# Patient Record
Sex: Female | Born: 1965 | Race: White | Hispanic: No | Smoking: Never smoker
Health system: Southern US, Community
[De-identification: ages and names within clinical notes are randomized; demographics above are authoritative.]

## PROBLEM LIST (undated history)

## (undated) DIAGNOSIS — M1990 Unspecified osteoarthritis, unspecified site: Secondary | ICD-10-CM

## (undated) DIAGNOSIS — R519 Headache, unspecified: Secondary | ICD-10-CM

## (undated) DIAGNOSIS — R32 Unspecified urinary incontinence: Secondary | ICD-10-CM

## (undated) DIAGNOSIS — B977 Papillomavirus as the cause of diseases classified elsewhere: Secondary | ICD-10-CM

## (undated) DIAGNOSIS — G4730 Sleep apnea, unspecified: Secondary | ICD-10-CM

## (undated) DIAGNOSIS — R51 Headache: Secondary | ICD-10-CM

## (undated) DIAGNOSIS — E7800 Pure hypercholesterolemia, unspecified: Secondary | ICD-10-CM

## (undated) DIAGNOSIS — K219 Gastro-esophageal reflux disease without esophagitis: Secondary | ICD-10-CM

## (undated) HISTORY — DX: Papillomavirus as the cause of diseases classified elsewhere: B97.7

## (undated) HISTORY — PX: VAGINA SURGERY: SHX829

## (undated) HISTORY — DX: Unspecified urinary incontinence: R32

## (undated) HISTORY — DX: Sleep apnea, unspecified: G47.30

## (undated) HISTORY — DX: Unspecified osteoarthritis, unspecified site: M19.90

## (undated) HISTORY — DX: Headache: R51

## (undated) HISTORY — DX: Pure hypercholesterolemia, unspecified: E78.00

## (undated) HISTORY — DX: Headache, unspecified: R51.9

## (undated) HISTORY — DX: Gastro-esophageal reflux disease without esophagitis: K21.9

---

## 2008-06-29 DIAGNOSIS — M6580 Other synovitis and tenosynovitis, unspecified site: Secondary | ICD-10-CM | POA: Insufficient documentation

## 2009-04-18 DIAGNOSIS — M25569 Pain in unspecified knee: Secondary | ICD-10-CM | POA: Insufficient documentation

## 2012-01-20 HISTORY — PX: SHOULDER SURGERY: SHX246

## 2012-02-23 DIAGNOSIS — G4733 Obstructive sleep apnea (adult) (pediatric): Secondary | ICD-10-CM | POA: Insufficient documentation

## 2012-02-23 DIAGNOSIS — F32A Depression, unspecified: Secondary | ICD-10-CM | POA: Insufficient documentation

## 2012-02-23 DIAGNOSIS — F329 Major depressive disorder, single episode, unspecified: Secondary | ICD-10-CM | POA: Insufficient documentation

## 2012-02-23 DIAGNOSIS — E039 Hypothyroidism, unspecified: Secondary | ICD-10-CM | POA: Insufficient documentation

## 2012-02-23 DIAGNOSIS — J309 Allergic rhinitis, unspecified: Secondary | ICD-10-CM | POA: Insufficient documentation

## 2012-04-19 DIAGNOSIS — E8881 Metabolic syndrome: Secondary | ICD-10-CM | POA: Insufficient documentation

## 2013-01-19 HISTORY — PX: PLANTAR FASCIA SURGERY: SHX746

## 2013-02-22 DIAGNOSIS — K219 Gastro-esophageal reflux disease without esophagitis: Secondary | ICD-10-CM | POA: Insufficient documentation

## 2014-10-08 HISTORY — PX: GALLBLADDER SURGERY: SHX652

## 2014-10-08 HISTORY — PX: GASTRIC BYPASS: SHX52

## 2015-02-25 LAB — HEPATIC FUNCTION PANEL
ALT: 18 U/L (ref 7–35)
AST: 26 U/L (ref 13–35)
Alkaline Phosphatase: 76 U/L (ref 25–125)
Bilirubin, Total: 0.4 mg/dL

## 2015-02-25 LAB — LIPID PANEL
Cholesterol: 126 mg/dL (ref 0–200)
HDL: 29 mg/dL — AB (ref 35–70)
LDL Cholesterol: 75 mg/dL
Triglycerides: 150 mg/dL (ref 40–160)

## 2015-02-25 LAB — BASIC METABOLIC PANEL
BUN: 13 mg/dL (ref 4–21)
Glucose: 83 mg/dL
POTASSIUM: 4.8 mmol/L (ref 3.4–5.3)
SODIUM: 143 mmol/L (ref 137–147)

## 2015-02-25 LAB — HEMOGLOBIN A1C: HEMOGLOBIN A1C: 5.1

## 2015-02-25 LAB — TSH: TSH: 2.26 u[IU]/mL (ref 0.41–5.90)

## 2015-04-24 DIAGNOSIS — G5702 Lesion of sciatic nerve, left lower limb: Secondary | ICD-10-CM | POA: Insufficient documentation

## 2015-12-04 DIAGNOSIS — J069 Acute upper respiratory infection, unspecified: Secondary | ICD-10-CM | POA: Diagnosis not present

## 2015-12-26 DIAGNOSIS — M722 Plantar fascial fibromatosis: Secondary | ICD-10-CM | POA: Diagnosis not present

## 2015-12-26 DIAGNOSIS — J0101 Acute recurrent maxillary sinusitis: Secondary | ICD-10-CM | POA: Diagnosis not present

## 2016-01-27 DIAGNOSIS — G4733 Obstructive sleep apnea (adult) (pediatric): Secondary | ICD-10-CM | POA: Diagnosis not present

## 2016-02-10 DIAGNOSIS — Z01419 Encounter for gynecological examination (general) (routine) without abnormal findings: Secondary | ICD-10-CM | POA: Diagnosis not present

## 2016-02-10 DIAGNOSIS — Z124 Encounter for screening for malignant neoplasm of cervix: Secondary | ICD-10-CM | POA: Diagnosis not present

## 2016-02-12 ENCOUNTER — Telehealth: Payer: Self-pay | Admitting: Family Medicine

## 2016-02-12 NOTE — Telephone Encounter (Signed)
Received this message via website:  I have had several symptoms that point towards my thyroid, but my dr did one blood test, and has said it is normal and has been that way for many years. I have always been very tired, dry skin and now over last 6-7 months or so, am having memory loss. I have read this could be sign of hypothyroidism, underactive which is what I am being treated for supposedly. I just feel further testing needs to be done and cant seem to get my pcp, who was at [facility] till I recently changed to a pcp with [place], whom I am awaiting appointment with in March. I don't know if I need to wait till then to see Dr Dallas Schimkeopeland or if I can be seen sooner with specialist. I am tired of being tired all the time.

## 2016-02-12 NOTE — Telephone Encounter (Signed)
Lindsay Neal- why don't we get her in sooner.  I can see her in a non new pt appt to discuss the thyroid.  Please give her a call.  Thank you

## 2016-02-12 NOTE — Telephone Encounter (Signed)
Appointment moved up for patient. Patient agreed.

## 2016-02-19 ENCOUNTER — Ambulatory Visit: Payer: Self-pay | Admitting: Family Medicine

## 2016-02-19 DIAGNOSIS — Z9884 Bariatric surgery status: Secondary | ICD-10-CM | POA: Diagnosis not present

## 2016-02-19 DIAGNOSIS — R5383 Other fatigue: Secondary | ICD-10-CM | POA: Diagnosis not present

## 2016-02-20 ENCOUNTER — Encounter: Payer: Self-pay | Admitting: Family Medicine

## 2016-02-20 ENCOUNTER — Ambulatory Visit (INDEPENDENT_AMBULATORY_CARE_PROVIDER_SITE_OTHER): Payer: BLUE CROSS/BLUE SHIELD | Admitting: Family Medicine

## 2016-02-20 VITALS — BP 110/70 | HR 71 | Ht 63.5 in | Wt 141.2 lb

## 2016-02-20 DIAGNOSIS — R5383 Other fatigue: Secondary | ICD-10-CM

## 2016-02-20 DIAGNOSIS — E034 Atrophy of thyroid (acquired): Secondary | ICD-10-CM

## 2016-02-20 DIAGNOSIS — Z9884 Bariatric surgery status: Secondary | ICD-10-CM | POA: Insufficient documentation

## 2016-02-20 DIAGNOSIS — Z9889 Other specified postprocedural states: Secondary | ICD-10-CM

## 2016-02-20 DIAGNOSIS — M7061 Trochanteric bursitis, right hip: Secondary | ICD-10-CM

## 2016-02-20 LAB — TSH: TSH: 1.07 u[IU]/mL (ref 0.35–4.50)

## 2016-02-20 MED ORDER — METHYLPREDNISOLONE ACETATE 40 MG/ML IJ SUSP
40.0000 mg | Freq: Once | INTRAMUSCULAR | Status: AC
  Administered 2016-02-20: 40 mg via INTRA_ARTICULAR

## 2016-02-20 NOTE — Patient Instructions (Addendum)
It was very nice to see you today!  I hope that the shot will help with your hip pain- please keep me posted about this We will check your thyroid today to make sure that your current dose of thyroid med is sufficient.   I also hope that having your CPAP adjusted will help with your fatigue  Congratulations on your healthier lifestyle- you have done a great job!

## 2016-02-20 NOTE — Progress Notes (Signed)
Pre visit review using our clinic review tool, if applicable. No additional management support is needed unless otherwise documented below in the visit note. 

## 2016-02-20 NOTE — Progress Notes (Addendum)
[facility] [address], [address] [place], [place] [address] [phone]  Date:  02/20/2016   Name:  Lindsay AhrConnie Grimes   DOB:  02/28/1965   MRN:  [id number]  PCP:  No primary care provider on file.    Chief Complaint: Hip Pain (c/o right hip pain that hurts more after doing a lot of wallking and will occ hurt during bedtime especially if laying on that side. Also c/o fatigue that has been present the last couple of years. Had flu vaccine 09/2015. )   History of Present Illness:  Lindsay Neal is a 51 y.o. 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  She still sees them for follow-up and was seen yesterday, all going well.  She has lost about 80 lbs and her reflux is much better  She notes that she tends to feel tired all the time- she will feel sleepy and like she might fall asleep any any time.  She will feel drowsy while on the drive to her job in the morning. She sleeps from 7:30 pm to approx 4:30 am- she may get up once to urinate.  She did have a sleep study, and has used CPAP. Right now she is not really using her machine- due to its not fitting correctly. this is being revamped on Monday  She has felt sleepy/ tired off an on for years Her last thyroid check was in May- she has been on the same dose of thyroid med for a long time  She is taking celexa -states she was given this for urinary frequency at night.  Was told that "a lot of women carry their stress in their bladder," she does feel like this helped with her nocturia.  She has been on this for a couple of years.  However she felt tired since before she started  this  She had a normal pap but positive HPV last week per her OBG- they will do a colposcopy on Monday.   She also has noted a problem with her right hip- worse with increased walking- for about 7 months.  She is not awrare of any injury to this area. It is tender over the lateral hip No redness, heat or swelling  Patient Active Problem List   Diagnosis Date Noted  . Hypothyroidism due to acquired atrophy of thyroid 02/20/2016  . History of gastric bypass 02/20/2016    No past medical history on file.  Past Surgical History:  Procedure Laterality Date  . CESAREAN SECTION  04/13/1991  . PLANTAR FASCIA SURGERY  2015  . VAGINA SURGERY      Social History  Substance Use Topics  . Smoking status: Not on file  . Smokeless tobacco: Not on file  . Alcohol use Not on file    No family history on file.  Allergies not on file  Medication list has been reviewed and updated.  No current outpatient prescriptions on file prior to visit.   No current facility-administered medications on file prior to visit.     Review of Systems:  As per HPI- otherwise negative.  Physical Examination: Vitals:   02/20/16 1009 02/20/16 1054  BP:  110/70  Pulse: 71    Vitals:   02/20/16 1009  Weight: 141 lb 3.2 oz (64 kg)  Height: 5' 3.5" (1.613 m)   Body mass index is 24.62 kg/m. Ideal Body Weight: Weight in (lb) to have BMI = 25: 143.1  GEN: WDWN, NAD, Non-toxic, A & O x 3, normal weight, looks well HEENT: Atraumatic, Normocephalic. Neck supple. No masses, No LAD. Ears and Nose: No external deformity. CV: RRR, No M/G/R. No JVD. No thrill. No extra heart sounds. PULM: CTA B, no wheezes, crackles, rhonchi. No retractions. No resp. distress. No accessory muscle use. ABD: S, NT, ND EXTR: No c/c/e NEURO Normal gait.  PSYCH: Normally interactive. Conversant. Not depressed or anxious appearing.  Calm demeanor.  Right hip: she is quite tender over the trochanteric bursa. No pain with  internal or external rotation of hip Discussed steroid injection- she would like to proceed  VC obtained. Prepared 4ml of 1% lidocaine and 40 mg of depo-medrol for injection Right hip prepped with betadine and alcohol. Injected steroid mixture into right trochanteric bursa- pt tolerated well, no complications   Assessment and Plan: Hypothyroidism due to acquired atrophy of thyroid - Plan: TSH  Trochanteric bursitis of right hip - Plan: methylPREDNISolone acetate (DEPO-MEDROL) injection 40 mg  History of gastric bypass  Other fatigue  Here today to establish care and discuss a couple of other concerns Will check her TSH today Encouraged her that restarting her CPAP may help with her fatigue- this is being adjusted for her next week Trial of steroid injection for her hip pain- she will let me know if not helpful Will plan further follow- up pending labs.   Signed Abbe Amsterdam, MD  Results for orders placed or performed in visit on 02/20/16  TSH  Result Value Ref Range   TSH 1.07 0.35 - 4.50 uIU/mL   Letter to pt regarding her TSH

## 2016-02-21 ENCOUNTER — Encounter: Payer: Self-pay | Admitting: Family Medicine

## 2016-02-24 DIAGNOSIS — R87810 Cervical high risk human papillomavirus (HPV) DNA test positive: Secondary | ICD-10-CM | POA: Diagnosis not present

## 2016-03-02 ENCOUNTER — Telehealth: Payer: Self-pay | Admitting: Family Medicine

## 2016-03-02 ENCOUNTER — Encounter: Payer: Self-pay | Admitting: Emergency Medicine

## 2016-03-02 DIAGNOSIS — M25551 Pain in right hip: Secondary | ICD-10-CM

## 2016-03-02 NOTE — Telephone Encounter (Signed)
Good Morning, Dr Dallas Schimkeopeland wanted me to follow up with her and let her know how the Steroid injection was doing I received at my last visit. I have not really seen any difference with the pain in my hip since the injection. I had experienced some of the pain moving around to my lower back Friday, and it comes and goes like that, but during the night when I was trying to turn over from my right side to the other, it was very difficult to turn over, but I did manage to get to my other side. This has been happening off and on for a while. I have had a lot of pain in my shoulders, now starting in my neck and my hips, mainly the right one. I just wanted to let her know what was going on and that there isn't much improvement since the injection 2 weeks ago. Not sure how long I need to wait to see results, but figured I should have felt some difference by now. And when I pick up things, the pain in the right shoulder moves down towards the elbow. Please advise what I need to do next. I have not had any xrays or anything done of any of these areas.. I use the steroid cream I have off and on, but its just a temporary fix sometimes and I know I was told to watch how much of it I use. Thanks in advance for your assistance.  Lindsay Neal

## 2016-03-02 NOTE — Telephone Encounter (Signed)
Please advise her that this probably means her pain was not coming from the bursa over the hip joint.  I would suggest that we see her again at her convenience and do x-rays.   She could also have a referral to sports med if she would prefer-  Please give her a call and find out how she would like to proceed.  Thank you!

## 2016-03-03 NOTE — Telephone Encounter (Signed)
Spoke to pt. Pt would like referral to sports med[facility].

## 2016-03-04 ENCOUNTER — Encounter: Payer: Self-pay | Admitting: Family Medicine

## 2016-03-04 ENCOUNTER — Ambulatory Visit (INDEPENDENT_AMBULATORY_CARE_PROVIDER_SITE_OTHER): Payer: BLUE CROSS/BLUE SHIELD | Admitting: Family Medicine

## 2016-03-04 DIAGNOSIS — M25551 Pain in right hip: Secondary | ICD-10-CM

## 2016-03-04 NOTE — Patient Instructions (Addendum)
You have SI joint dysfunction with gluteus medius spasms/weakness and piriformis syndrome. Try to avoid painful activities when possible. Pick 2-3 stretches where you feel the pull in the area of pain - do 3 of these and hold for 20-30 seconds at least once a day Hip side raises and standing hip rotations 3 sets of 10 once a day. Add ankle weight if these become too easy. Consider tennis ball to massage area when sitting Consider physical therapy, imaging of lumbar spine if not improving. Follow up with me in 6 weeks for reevaluation.

## 2016-03-05 DIAGNOSIS — M25551 Pain in right hip: Secondary | ICD-10-CM | POA: Insufficient documentation

## 2016-03-05 NOTE — Assessment & Plan Note (Signed)
due to combination of issues (SI joint dysfunction, piriformis syndrome, gluteus medius strain/spasms).  Shown home exercises and stretches to do daily.  Handout provided also.  Tennis ball massage.  Tylenol if needed.  Consider physical therapy (will call us if she would like to try this).  If still not improving would consider imaging of lumbar spine.  Follow up in 6 weeks.

## 2016-03-05 NOTE — Progress Notes (Signed)
PCP and consultation requested by: Dr. Abbe Amsterdam MD  Subjective:   HPI: Patient is a 51 y.o. female here for right hip pain.  Patient reports she's had about 8 months of lateral right hip pain. Notices pain go into her right low back and into the groin. Worse when turning over and lying on the right side. No numbness or tingling. No bowel/bladder dysfunction. Worse with a lot of walking and stairs. Pain level 1/10 now, dull. No skin changes. Had trochanteric bursa injection without benefit.  Past Medical History:  Diagnosis Date  . Arthritis   . Frequent headaches   . GERD (gastroesophageal reflux disease)   . High cholesterol   . HPV (human papilloma virus) infection   . Sleep apnea   . Urine incontinence     No current outpatient prescriptions on file prior to visit.   No current facility-administered medications on file prior to visit.     Past Surgical History:  Procedure Laterality Date  . CESAREAN SECTION  04/13/1991  . GALLBLADDER SURGERY  10/08/2014  . GASTRIC BYPASS  10/08/2014  . PLANTAR FASCIA SURGERY  2015  . SHOULDER SURGERY Right 2014  . VAGINA SURGERY      Allergies  Allergen Reactions  . Penicillins   . Sulfa Antibiotics     Social History   Social History  . Marital status: Married    Spouse name: N/A  . Number of children: N/A  . Years of education: N/A   Occupational History  . Not on file.   Social History Main Topics  . Smoking status: Never Smoker  . Smokeless tobacco: Never Used  . Alcohol use No  . Drug use: Unknown  . Sexual activity: Not on file   Other Topics Concern  . Not on file   Social History Narrative  . No narrative on file    Family History  Problem Relation Age of Onset  . Arthritis Mother   . Diabetes Mother   . Elevated Lipids Mother   . Elevated Lipids Father   . Heart disease Father   . Stroke Father   . Hypertension Father   . Heart disease Brother   . Colon cancer Maternal Grandmother    . Diabetes Maternal Grandmother   . Elevated Lipids Maternal Grandfather   . Arthritis Paternal Grandmother   . Hypertension Paternal Grandmother     BP 102/65   Pulse 80   Ht 5\' 4"  (1.626 m)   Wt 142 lb (64.4 kg)   BMI 24.37 kg/m   Review of Systems: See HPI above.     Objective:  Physical Exam:  Gen: NAD, comfortable in exam room  Back/right hip: No gross deformity, scoliosis. TTP over piriformis, posterior to greater trochanter, SI joint.  No midline or bony TTP.  No other tenderness. FROM hip without pain.  No pain on back motions. Strength LEs 5/5 all muscle groups except 4+/5 with right hip abduction.   2+ MSRs in patellar and achilles tendons, equal bilaterally. Negative SLRs. Sensation intact to light touch bilaterally. Negative logroll bilateral hips Positive fabers on right, negative left.  Positive piriformis right, negative left.   Assessment & Plan:  1. Right hip pain - due to combination of issues (SI joint dysfunction, piriformis syndrome, gluteus medius strain/spasms).  Shown home exercises and stretches to do daily.  Handout provided also.  Tennis ball massage.  Tylenol if needed.  Consider physical therapy (will call us if she would like to try this).  If still not improving would consider imaging of lumbar spine.  Follow up in 6 weeks.

## 2016-03-19 ENCOUNTER — Telehealth: Payer: Self-pay | Admitting: Family Medicine

## 2016-03-19 ENCOUNTER — Ambulatory Visit: Payer: Self-pay | Admitting: Family Medicine

## 2016-03-19 ENCOUNTER — Encounter: Payer: Self-pay | Admitting: Family Medicine

## 2016-03-19 NOTE — Telephone Encounter (Signed)
Received [facility] for her, will abstract as needed

## 2016-03-19 NOTE — Progress Notes (Unsigned)
Est. GFR Non-Black >60 Chloride: 110 CO2: 23

## 2016-03-26 ENCOUNTER — Ambulatory Visit: Payer: Self-pay | Admitting: Family Medicine

## 2016-04-01 ENCOUNTER — Ambulatory Visit: Payer: BLUE CROSS/BLUE SHIELD | Admitting: Family Medicine

## 2016-05-08 ENCOUNTER — Ambulatory Visit: Payer: BLUE CROSS/BLUE SHIELD | Admitting: Family Medicine

## 2016-05-08 ENCOUNTER — Telehealth: Payer: Self-pay | Admitting: Family Medicine

## 2016-05-08 NOTE — Telephone Encounter (Signed)
No charge. 

## 2016-05-08 NOTE — Telephone Encounter (Signed)
Received vm at 11:55 to cancel appt. Not sure of who left vm maybe spouse but vm states that pt is unable to make appt today at 2:15.

## 2016-05-12 DIAGNOSIS — R002 Palpitations: Secondary | ICD-10-CM | POA: Insufficient documentation

## 2016-05-18 DIAGNOSIS — J342 Deviated nasal septum: Secondary | ICD-10-CM | POA: Insufficient documentation

## 2016-05-18 DIAGNOSIS — J343 Hypertrophy of nasal turbinates: Secondary | ICD-10-CM | POA: Insufficient documentation

## 2016-07-14 NOTE — Progress Notes (Signed)
[facility] [address], [address] [place], [place] [address] [phone] [phone]  Date:  07/15/2016   Name:  Lindsay AhrConnie Maske   DOB:  09/10/1965   MRN:  [id number]  PCP:  Pearline Cablesopland, Annelle Behrendt C, MD    Chief Complaint: Hip Pain (Right)   History of Present Illness:  Lindsay Neal is a 51 y.o. 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  She still sees them for follow-up and was seen yesterday, all going well.  She has lost about 80 lbs and her reflux is much better  She notes that she tends to feel tired all the time- she will feel sleepy and like she might fall asleep any any time.  She will feel drowsy while on the drive to her job in the morning. She sleeps from 7:30 pm to approx 4:30 am- she may get up once to urinate.  She did have a sleep study, and has used CPAP. Right now she is not really using her machine- due to its not fitting correctly. this is being revamped on Monday She has felt sleepy/ tired off an on for years Her last thyroid check was in May- she has been on the same dose of thyroid med for a long time  She is taking celexa -states she was given this for urinary frequency at night.  Was told that "a lot of women carry their stress in their bladder," she does feel like this helped with her nocturia.  She has been on this for a couple of years.  However she felt tired since before she started this  She had a normal pap but positive HPV last week per her OBG- they will do a colposcopy on Monday.   She also has noted a problem with her right hip-  worse with increased walking- for about 7 months.  She is not awrare of any injury to this area. It is tender over the lateral hip No redness, heat or swelling  Lab Results  Component Value Date   TSH 1.07 02/20/2016   We did a greater trochanteric bursa injection for her back in February- this did help and lasted pretty well [place]  She walked quite a bit and her RIGHT hip pain has returned.  No acute injury, pain does not radiate down her leg and she does not have any numbness or weakness of her leg  She had some nasal surgery since then to reduce her turbinates  She does seem to be doing well and sleeping better since her operation - it has helped with her OSA symptoms. Sh is able to focus better at work and has gotten positive feedback from her supervisor  Never a smoker  Wt Readings from Last 3 Encounters:  07/15/16 148 lb 3.2 oz (67.2 kg)  03/04/16 142 lb (64.4 kg)  02/20/16 141 lb 3.2 oz (64 kg)    Patient Active Problem List   Diagnosis Date Noted  . Right hip pain 03/05/2016  . Hypothyroidism due  to acquired atrophy of thyroid 02/20/2016  . Status post bariatric surgery 02/20/2016  . 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 GERD (gastroesophageal reflux disease) 02/22/2013  . Dysmetabolic syndrome X 04/19/2012  . Allergic rhinitis 02/23/2012  . Depression 02/23/2012  . Sleep apnea, obstructive 02/23/2012    Past Medical History:  Diagnosis Date  . Arthritis   . Frequent headaches   . GERD (gastroesophageal reflux disease)   . High cholesterol   . HPV (human papilloma virus) infection   . Sleep apnea   . Urine incontinence     Past Surgical History:  Procedure Laterality Date  . CESAREAN SECTION  04/13/1991  . GALLBLADDER SURGERY  10/08/2014  . GASTRIC BYPASS  10/08/2014  . PLANTAR FASCIA SURGERY  2015  . SHOULDER SURGERY Right 2014  . VAGINA SURGERY      Social History  Substance Use Topics  .  Smoking status: Never Smoker  . Smokeless tobacco: Never Used  . Alcohol use No    Family History  Problem Relation Age of Onset  . Arthritis Mother   . Diabetes Mother   . Elevated Lipids Mother   . Elevated Lipids Father   . Heart disease Father   . Stroke Father   . Hypertension Father   . Heart disease Brother   . Colon cancer Maternal Grandmother   . Diabetes Maternal Grandmother   . Elevated Lipids Maternal Grandfather   . Arthritis Paternal Grandmother   . Hypertension Paternal Grandmother     Allergies  Allergen Reactions  . Penicillins   . Sulfa Antibiotics     Medication list has been reviewed and updated.  Current Outpatient Prescriptions on File Prior to Visit  Medication Sig Dispense Refill  . escitalopram (LEXAPRO) 10 MG tablet     . esomeprazole (NEXIUM) 40 MG capsule TAKE 1 CAPSULE BY MOUTH 2 TIMES DAILY BEFORE MEALS.    . fenofibrate 160 MG tablet     . levothyroxine (SYNTHROID, LEVOTHROID) 75 MCG tablet TAKE 1 TABLET DAILY.    . multivitamin-iron-minerals-folic acid (CENTRUM) chewable tablet Chew by mouth.     No current facility-administered medications on file prior to visit.     Review of Systems:  As per HPI- otherwise negative.   Physical Examination: Vitals:   07/15/16 1518  Pulse: 70  Temp: 97.8 F (36.6 C)   Vitals:   07/15/16 1518  Weight: 148 lb 3.2 oz (67.2 kg)  Height: 5\' 4"  (1.626 m)   Body mass index is 25.44 kg/m. Ideal Body Weight: Weight in (lb) to have BMI = 25: 145.3  GEN: WDWN, NAD, Non-toxic, A & O x 3, normal weight, looks well HEENT: Atraumatic, Normocephalic. Neck supple. No masses, No LAD. Ears and Nose: No external deformity. CV: RRR, No M/G/R. No JVD. No thrill. No extra heart sounds. PULM: CTA B, no wheezes, crackles, rhonchi. No retractions. No resp. distress. No accessory muscle use. ABD: S, NT, ND, +BS. No rebound. No HSM. EXTR: No c/c/e NEURO Normal gait.  PSYCH: Normally interactive. Conversant.  Not depressed or anxious appearing.  Calm demeanor.  Looks well Tender over the right greater trochanteric bursa.  Normal ROM of the hip, and normal strength, sensation, DTR of both LE.  Negative SLR   VC obtained. Prepared 4ml of 1% lidocaine and 40 mg of depo-medrol for injection Right hip prepped with betadine and alcohol. Injected steroid mixture into right trochanteric bursa- pt tolerated well, no complications  Assessment and Plan: Greater  trochanteric bursitis of right hip  Acute hip pain, unspecified laterality - Plan: methylPREDNISolone acetate (DEPO-MEDROL) injection 40 mg, DG HIP UNILAT W OR W/O PELVIS 2-3 VIEWS RIGHT   Dg Hip Unilat W Or W/o Pelvis 2-3 Views Right  Result Date: 07/15/2016 CLINICAL DATA:  RIGHT hip pain for 1 year, no known injury EXAM: DG HIP (WITH OR WITHOUT PELVIS) 2-3V RIGHT COMPARISON:  None FINDINGS: Osseous demineralization. Hip and SI joint spaces preserved. No acute fracture, dislocation, or bone destruction. IMPRESSION: No acute osseous abnormalities. Electronically Signed   By: Ulyses Southward M.D.   On: 07/15/2016 16:16   Here today seeking a repeat GTB injection.  Performed injection as above.  Will also got films for her today as her pain has been persistent.    Received film report as above, nothing acute Called pt but no answer, VM is full.   Will send her a letter with her rexults  Signed Abbe Amsterdam, MD

## 2016-07-15 ENCOUNTER — Encounter: Payer: Self-pay | Admitting: Family Medicine

## 2016-07-15 ENCOUNTER — Ambulatory Visit (HOSPITAL_BASED_OUTPATIENT_CLINIC_OR_DEPARTMENT_OTHER)
Admission: RE | Admit: 2016-07-15 | Discharge: 2016-07-15 | Disposition: A | Discharge status: Home / Self Care | Payer: BLUE CROSS/BLUE SHIELD | Source: Ambulatory Visit | Attending: Family Medicine | Admitting: Family Medicine

## 2016-07-15 ENCOUNTER — Ambulatory Visit (INDEPENDENT_AMBULATORY_CARE_PROVIDER_SITE_OTHER): Payer: BLUE CROSS/BLUE SHIELD | Admitting: Family Medicine

## 2016-07-15 VITALS — HR 70 | Temp 97.8°F | Ht 64.0 in | Wt 148.2 lb

## 2016-07-15 DIAGNOSIS — M25559 Pain in unspecified hip: Secondary | ICD-10-CM | POA: Diagnosis not present

## 2016-07-15 DIAGNOSIS — M7061 Trochanteric bursitis, right hip: Secondary | ICD-10-CM

## 2016-07-15 DIAGNOSIS — M25551 Pain in right hip: Secondary | ICD-10-CM | POA: Diagnosis not present

## 2016-07-15 MED ORDER — METHYLPREDNISOLONE ACETATE 40 MG/ML IJ SUSP
40.0000 mg | Freq: Once | INTRAMUSCULAR | Status: AC
  Administered 2016-07-15: 40 mg via INTRAMUSCULAR

## 2016-07-15 NOTE — Patient Instructions (Addendum)
Please have your hip x-rayed- then you can head home and I will be in touch with your results asap Let me know if any change or worsening of your hip symptoms

## 2016-07-16 ENCOUNTER — Encounter: Payer: Self-pay | Admitting: Family Medicine

## 2016-10-21 ENCOUNTER — Encounter: Payer: Self-pay | Admitting: Family Medicine

## 2016-10-21 ENCOUNTER — Ambulatory Visit (INDEPENDENT_AMBULATORY_CARE_PROVIDER_SITE_OTHER): Payer: BLUE CROSS/BLUE SHIELD | Admitting: Family Medicine

## 2016-10-21 VITALS — BP 102/62 | HR 65 | Temp 97.9°F | Ht 64.0 in | Wt 149.0 lb

## 2016-10-21 DIAGNOSIS — Z131 Encounter for screening for diabetes mellitus: Secondary | ICD-10-CM

## 2016-10-21 DIAGNOSIS — E875 Hyperkalemia: Secondary | ICD-10-CM

## 2016-10-21 DIAGNOSIS — E039 Hypothyroidism, unspecified: Secondary | ICD-10-CM

## 2016-10-21 DIAGNOSIS — Z9884 Bariatric surgery status: Secondary | ICD-10-CM

## 2016-10-21 DIAGNOSIS — Z23 Encounter for immunization: Secondary | ICD-10-CM | POA: Diagnosis not present

## 2016-10-21 DIAGNOSIS — Z130 Encounter for screening for diseases of the blood and blood-forming organs and certain disorders involving the immune mechanism: Secondary | ICD-10-CM

## 2016-10-21 DIAGNOSIS — E785 Hyperlipidemia, unspecified: Secondary | ICD-10-CM

## 2016-10-21 DIAGNOSIS — Z1211 Encounter for screening for malignant neoplasm of colon: Secondary | ICD-10-CM | POA: Diagnosis not present

## 2016-10-21 DIAGNOSIS — F325 Major depressive disorder, single episode, in full remission: Secondary | ICD-10-CM | POA: Diagnosis not present

## 2016-10-21 DIAGNOSIS — Z0000 Encounter for general adult medical examination without abnormal findings: Secondary | ICD-10-CM | POA: Diagnosis not present

## 2016-10-21 LAB — CBC
HCT: 39.3 % (ref 36.0–46.0)
Hemoglobin: 12.7 g/dL (ref 12.0–15.0)
MCHC: 32.3 g/dL (ref 30.0–36.0)
MCV: 93 fl (ref 78.0–100.0)
Platelets: 327 10*3/uL (ref 150.0–400.0)
RBC: 4.23 Mil/uL (ref 3.87–5.11)
RDW: 13.3 % (ref 11.5–15.5)
WBC: 7.7 10*3/uL (ref 4.0–10.5)

## 2016-10-21 LAB — LIPID PANEL
Cholesterol: 162 mg/dL (ref 0–200)
HDL: 56.1 mg/dL (ref >39.00)
LDL CALC: 89 mg/dL (ref 0–99)
NONHDL: 106.19
Total CHOL/HDL Ratio: 3
Triglycerides: 88 mg/dL (ref 0.0–149.0)
VLDL: 17.6 mg/dL (ref 0.0–40.0)

## 2016-10-21 LAB — COMPREHENSIVE METABOLIC PANEL
ALT: 15 U/L (ref 0–35)
AST: 24 U/L (ref 0–37)
Albumin: 4.3 g/dL (ref 3.5–5.2)
Alkaline Phosphatase: 63 U/L (ref 39–117)
BUN: 18 mg/dL (ref 6–23)
CHLORIDE: 105 meq/L (ref 96–112)
CO2: 30 mEq/L (ref 19–32)
Calcium: 10.2 mg/dL (ref 8.4–10.5)
Creatinine, Ser: 0.78 mg/dL (ref 0.40–1.20)
GFR: 82.92 mL/min (ref >60.00)
GLUCOSE: 83 mg/dL (ref 70–99)
POTASSIUM: 5.2 meq/L — AB (ref 3.5–5.1)
SODIUM: 142 meq/L (ref 135–145)
Total Bilirubin: 0.4 mg/dL (ref 0.2–1.2)
Total Protein: 7.2 g/dL (ref 6.0–8.3)

## 2016-10-21 LAB — HEMOGLOBIN A1C: Hgb A1c MFr Bld: 5.3 % (ref 4.6–6.5)

## 2016-10-21 LAB — VITAMIN B12: Vitamin B-12: 893 pg/mL (ref 211–911)

## 2016-10-21 LAB — TSH: TSH: 1.56 u[IU]/mL (ref 0.35–4.50)

## 2016-10-21 LAB — VITAMIN D 25 HYDROXY (VIT D DEFICIENCY, FRACTURES): VITD: 31.56 ng/mL (ref 30.00–100.00)

## 2016-10-21 LAB — FERRITIN: Ferritin: 168.4 ng/mL (ref 10.0–291.0)

## 2016-10-21 MED ORDER — ESCITALOPRAM OXALATE 10 MG PO TABS: 10.0000 mg | ORAL_TABLET | Freq: Every day | ORAL | 3 refills | Status: DC

## 2016-10-21 NOTE — Progress Notes (Addendum)
[facility] [address], [address] [place], [place] [address] [phone]  Date:  10/21/2016   Name:  Lindsay Neal   DOB:  1965-06-07   MRN:  [id number]  PCP:  Pearline Cables, MD    Chief Complaint: Annual Exam (Pt here for CPE, with fasting labs and flu vaccine today. Refill needed on Escitalopram. )   History of Present Illness:  Lindsay Neal is a 51 y.o. 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 Right hip pain 03/05/2016  . Hypothyroidism due to acquired atrophy of thyroid 02/20/2016  . Status post bariatric surgery 02/20/2016  . 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 GERD (gastroesophageal reflux disease) 02/22/2013  . Dysmetabolic syndrome X 04/19/2012  . Allergic rhinitis 02/23/2012  . Depression 02/23/2012  . Sleep apnea, obstructive 02/23/2012    Past Medical History:  Diagnosis Date  . Arthritis   . Frequent headaches   . GERD  (gastroesophageal reflux disease)   . High cholesterol   . HPV (human papilloma virus) infection   . Sleep apnea   . Urine incontinence     Past Surgical History:  Procedure Laterality Date  . CESAREAN SECTION  04/13/1991  . GALLBLADDER SURGERY  10/08/2014  . GASTRIC BYPASS  10/08/2014  . PLANTAR FASCIA SURGERY  2015  . SHOULDER SURGERY Right 2014  . VAGINA SURGERY      Social History  Substance Use Topics  . Smoking status: Never Smoker  . Smokeless tobacco: Never Used  . Alcohol use No    Family History  Problem Relation Age of Onset  . Arthritis Mother   . Diabetes Mother   . Elevated Lipids Mother   . Elevated Lipids Father   . Heart disease Father   . Stroke Father   . Hypertension Father   . Heart disease Brother   . Colon cancer Maternal Grandmother   . Diabetes Maternal Grandmother   . Elevated Lipids Maternal Grandfather   . Arthritis Paternal Grandmother   . Hypertension Paternal Grandmother     Allergies  Allergen Reactions  . Penicillins   . Sulfa Antibiotics     Medication list has been reviewed and updated.  Current Outpatient Prescriptions on File Prior to Visit  Medication Sig Dispense Refill  . Cyanocobalamin (B-12) 1000 MCG LOZG Take by mouth.    Marland Kitchen  escitalopram (LEXAPRO) 10 MG tablet     . esomeprazole (NEXIUM) 40 MG capsule TAKE 1 CAPSULE BY MOUTH 2 TIMES DAILY BEFORE MEALS.    . fenofibrate 160 MG tablet     . IRON PO Take 65 mg by mouth daily.    Marland Kitchen levothyroxine (SYNTHROID, LEVOTHROID) 75 MCG tablet TAKE 1 TABLET DAILY.    . multivitamin-iron-minerals-folic acid (CENTRUM) chewable tablet Chew by mouth.     No current facility-administered medications on file prior to visit.     Review of Systems:  As per HPI- otherwise negative. No fever or chills No CP or SOB No unusual moles or rashes    Physical Examination: Vitals:   10/21/16 0930  BP: 102/62  Pulse: 65  Temp: 97.9 F (36.6 C)  SpO2: 100%   Vitals:   10/21/16  0930  Weight: 149 lb (67.6 kg)  Height:  (1.626 m)   Body mass index is 25.58 kg/m. Ideal Body Weight: Weight in (lb) to have BMI = 25: 145.3  GEN: WDWN, NAD, Non-toxic, A & O x 3, normal weight, looks well HEENT: Atraumatic, Normocephalic. Neck supple. No masses, No LAD.  Bilateral TM wnl, oropharynx normal.  PEERL,EOMI.   Ears and Nose: No external deformity. CV: RRR, No M/G/R. No JVD. No thrill. No extra heart sounds. PULM: CTA B, no wheezes, crackles, rhonchi. No retractions. No resp. distress. No accessory muscle use. ABD: S, NT, ND. No rebound. No HSM. EXTR: No c/c/e NEURO Normal gait.  PSYCH: Normally interactive. Conversant. Not depressed or anxious appearing.  Calm demeanor.    Assessment and Plan: Physical exam  Immunization due - Plan: Flu Vaccine QUAD 36+ mos IM (Fluarix & Fluzone Quad PF  Dyslipidemia - Plan: Lipid panel  H/O gastric bypass - Plan: Ferritin, Vitamin D (25 hydroxy), B12  Screening for diabetes mellitus - Plan: Comprehensive metabolic panel, Hemoglobin A1c  Acquired hypothyroidism - Plan: TSH  Screening for deficiency anemia - Plan: CBC  Depression, major, single episode, complete remission (HCC) - Plan: escitalopram (LEXAPRO) 10 MG tablet  Screening for colon cancer  CPE today Ordered cologuard Encouraged exercise, continued weight management Refilled her lexapro which is working fine for her B12, ferritin, vit D level due to history of gastric bypass Flu shot today Check TSH Will plan further follow- up pending labs.   Signed Abbe Amsterdam, MD  Received her labs  Results for orders placed or performed in visit on 10/21/16  Ferritin  Result Value Ref Range   Ferritin 168.4 10.0 - 291.0 ng/mL  CBC  Result Value Ref Range   WBC 7.7 4.0 - 10.5 K/uL   RBC 4.23 3.87 - 5.11 Mil/uL   Platelets 327.0 150.0 - 400.0 K/uL   Hemoglobin 12.7 12.0 - 15.0 g/dL   HCT 16.1 09.6 - 04.5 %   MCV 93.0 78.0 - 100.0 fl   MCHC 32.3 30.0 -  36.0 g/dL   RDW 40.9 81.1 - 91.4 %  Comprehensive metabolic panel  Result Value Ref Range   Sodium 142 135 - 145 mEq/L   Potassium 5.2 (H) 3.5 - 5.1 mEq/L   Chloride 105 96 - 112 mEq/L   CO2 30 19 - 32 mEq/L   Glucose, Bld 83 70 - 99 mg/dL   BUN 18 6 - 23 mg/dL   Creatinine, Ser 7.82 0.40 - 1.20 mg/dL   Total Bilirubin 0.4 0.2 - 1.2 mg/dL   Alkaline Phosphatase 63 39 - 117 U/L   AST 24 0 -  37 U/L   ALT 15 0 - 35 U/L   Total Protein 7.2 6.0 - 8.3 g/dL   Albumin 4.3 3.5 - 5.2 g/dL   Calcium 40.9 8.4 - 81.1 mg/dL   GFR 91.47 >82.95 mL/min  Lipid panel  Result Value Ref Range   Cholesterol 162 0 - 200 mg/dL   Triglycerides 62.1 0.0 - 149.0 mg/dL   HDL 30.86 >57.84 mg/dL   VLDL 69.6 0.0 - 29.5 mg/dL   LDL Cholesterol 89 0 - 99 mg/dL   Total CHOL/HDL Ratio 3    NonHDL 106.19   TSH  Result Value Ref Range   TSH 1.56 0.35 - 4.50 uIU/mL  Hemoglobin A1c  Result Value Ref Range   Hgb A1c MFr Bld 5.3 4.6 - 6.5 %  Vitamin D (25 hydroxy)  Result Value Ref Range   VITD 31.56 30.00 - 100.00 ng/mL  B12  Result Value Ref Range   Vitamin B-12 893 211 - 911 pg/mL   Message to pt

## 2016-10-21 NOTE — Patient Instructions (Signed)
It was a pleasure to see you today as always!   We will get you set up for cologuard testing for colon cancer- I always recommend making sure that this is covered by your insurance prior to doing the test  I will be in touch with your labs asap   Health Maintenance, Female Adopting a healthy lifestyle and getting preventive care can go a long way to promote health and wellness. Talk with your health care provider about what schedule of regular examinations is right for you. This is a good chance for you to check in with your provider about disease prevention and staying healthy. In between checkups, there are plenty of things you can do on your own. Experts have done a lot of research about which lifestyle changes and preventive measures are most likely to keep you healthy. Ask your health care provider for more information. Weight and diet Eat a healthy diet  Be sure to include plenty of vegetables, fruits, low-fat dairy products, and lean protein.  Do not eat a lot of foods high in solid fats, added sugars, or salt.  Get regular exercise. This is one of the most important things you can do for your health. ? Most adults should exercise for at least 150 minutes each week. The exercise should increase your heart rate and make you sweat (moderate-intensity exercise). ? Most adults should also do strengthening exercises at least twice a week. This is in addition to the moderate-intensity exercise.  Maintain a healthy weight  Body mass index (BMI) is a measurement that can be used to identify possible weight problems. It estimates body fat based on height and weight. Your health care provider can help determine your BMI and help you achieve or maintain a healthy weight.  For females 54 years of age and older: ? A BMI below 18.5 is considered underweight. ? A BMI of 18.5 to 24.9 is normal. ? A BMI of 25 to 29.9 is considered overweight. ? A BMI of 30 and above is considered obese.  Watch  levels of cholesterol and blood lipids  You should start having your blood tested for lipids and cholesterol at 50 years of age, then have this test every 5 years.  You may need to have your cholesterol levels checked more often if: ? Your lipid or cholesterol levels are high. ? You are older than 50 years of age. ? You are at high risk for heart disease.  Cancer screening Lung Cancer  Lung cancer screening is recommended for adults 72-8 years old who are at high risk for lung cancer because of a history of smoking.  A yearly low-dose CT scan of the lungs is recommended for people who: ? Currently smoke. ? Have quit within the past 15 years. ? Have at least a 30-pack-year history of smoking. A pack year is smoking an average of one pack of cigarettes a day for 1 year.  Yearly screening should continue until it has been 15 years since you quit.  Yearly screening should stop if you develop a health problem that would prevent you from having lung cancer treatment.  Breast Cancer  Practice breast self-awareness. This means understanding how your breasts normally appear and feel.  It also means doing regular breast self-exams. Let your health care provider know about any changes, no matter how small.  If you are in your 20s or 30s, you should have a clinical breast exam (CBE) by a health care provider every 1-3 years as  part of a regular health exam.  If you are 40 or older, have a CBE every year. Also consider having a breast X-ray (mammogram) every year.  If you have a family history of breast cancer, talk to your health care provider about genetic screening.  If you are at high risk for breast cancer, talk to your health care provider about having an MRI and a mammogram every year.  Breast cancer gene (BRCA) assessment is recommended for women who have family members with BRCA-related cancers. BRCA-related cancers include: ? Breast. ? Ovarian. ? Tubal. ? Peritoneal  cancers.  Results of the assessment will determine the need for genetic counseling and BRCA1 and BRCA2 testing.  Cervical Cancer Your health care provider may recommend that you be screened regularly for cancer of the pelvic organs (ovaries, uterus, and vagina). This screening involves a pelvic examination, including checking for microscopic changes to the surface of your cervix (Pap test). You may be encouraged to have this screening done every 3 years, beginning at age 75.  For women ages 51-65, health care providers may recommend pelvic exams and Pap testing every 3 years, or they may recommend the Pap and pelvic exam, combined with testing for human papilloma virus (HPV), every 5 years. Some types of HPV increase your risk of cervical cancer. Testing for HPV may also be done on women of any age with unclear Pap test results.  Other health care providers may not recommend any screening for nonpregnant women who are considered low risk for pelvic cancer and who do not have symptoms. Ask your health care provider if a screening pelvic exam is right for you.  If you have had past treatment for cervical cancer or a condition that could lead to cancer, you need Pap tests and screening for cancer for at least 20 years after your treatment. If Pap tests have been discontinued, your risk factors (such as having a new sexual partner) need to be reassessed to determine if screening should resume. Some women have medical problems that increase the chance of getting cervical cancer. In these cases, your health care provider may recommend more frequent screening and Pap tests.  Colorectal Cancer  This type of cancer can be detected and often prevented.  Routine colorectal cancer screening usually begins at 50 years of age and continues through 50 years of age.  Your health care provider may recommend screening at an earlier age if you have risk factors for colon cancer.  Your health care provider may also  recommend using home test kits to check for hidden blood in the stool.  A small camera at the end of a tube can be used to examine your colon directly (sigmoidoscopy or colonoscopy). This is done to check for the earliest forms of colorectal cancer.  Routine screening usually begins at age 76.  Direct examination of the colon should be repeated every 5-10 years through 50 years of age. However, you may need to be screened more often if early forms of precancerous polyps or small growths are found.  Skin Cancer  Check your skin from head to toe regularly.  Tell your health care provider about any new moles or changes in moles, especially if there is a change in a mole's shape or color.  Also tell your health care provider if you have a mole that is larger than the size of a pencil eraser.  Always use sunscreen. Apply sunscreen liberally and repeatedly throughout the day.  Protect yourself by wearing  long sleeves, pants, a wide-brimmed hat, and sunglasses whenever you are outside.  Heart disease, diabetes, and high blood pressure  High blood pressure causes heart disease and increases the risk of stroke. High blood pressure is more likely to develop in: ? People who have blood pressure in the high end of the normal range (130-139/85-89 mm Hg). ? People who are overweight or obese. ? People who are African American.  If you are 85-44 years of age, have your blood pressure checked every 3-5 years. If you are 74 years of age or older, have your blood pressure checked every year. You should have your blood pressure measured twice-once when you are at a hospital or clinic, and once when you are not at a hospital or clinic. Record the average of the two measurements. To check your blood pressure when you are not at a hospital or clinic, you can use: ? An automated blood pressure machine at a pharmacy. ? A home blood pressure monitor.  If you are between 24 years and 41 years old, ask your  health care provider if you should take aspirin to prevent strokes.  Have regular diabetes screenings. This involves taking a blood sample to check your fasting blood sugar level. ? If you are at a normal weight and have a low risk for diabetes, have this test once every three years after 50 years of age. ? If you are overweight and have a high risk for diabetes, consider being tested at a younger age or more often. Preventing infection Hepatitis B  If you have a higher risk for hepatitis B, you should be screened for this virus. You are considered at high risk for hepatitis B if: ? You were born in a country where hepatitis B is common. Ask your health care provider which countries are considered high risk. ? Your parents were born in a high-risk country, and you have not been immunized against hepatitis B (hepatitis B vaccine). ? You have HIV or AIDS. ? You use needles to inject street drugs. ? You live with someone who has hepatitis B. ? You have had sex with someone who has hepatitis B. ? You get hemodialysis treatment. ? You take certain medicines for conditions, including cancer, organ transplantation, and autoimmune conditions.  Hepatitis C  Blood testing is recommended for: ? Everyone born from [birth year] through 1964. ? Anyone with known risk factors for hepatitis C.  Sexually transmitted infections (STIs)  You should be screened for sexually transmitted infections (STIs) including gonorrhea and chlamydia if: ? You are sexually active and are younger than 50 years of age. ? You are older than 50 years of age and your health care provider tells you that you are at risk for this type of infection. ? Your sexual activity has changed since you were last screened and you are at an increased risk for chlamydia or gonorrhea. Ask your health care provider if you are at risk.  If you do not have HIV, but are at risk, it may be recommended that you take a prescription medicine daily to  prevent HIV infection. This is called pre-exposure prophylaxis (PrEP). You are considered at risk if: ? You are sexually active and do not regularly use condoms or know the HIV status of your partner(s). ? You take drugs by injection. ? You are sexually active with a partner who has HIV.  Talk with your health care provider about whether you are at high risk of being infected with HIV.  If you choose to begin PrEP, you should first be tested for HIV. You should then be tested every 3 months for as long as you are taking PrEP. Pregnancy  If you are premenopausal and you may become pregnant, ask your health care provider about preconception counseling.  If you may become pregnant, take 400 to 800 micrograms (mcg) of folic acid every day.  If you want to prevent pregnancy, talk to your health care provider about birth control (contraception). Osteoporosis and menopause  Osteoporosis is a disease in which the bones lose minerals and strength with aging. This can result in serious bone fractures. Your risk for osteoporosis can be identified using a bone density scan.  If you are 19 years of age or older, or if you are at risk for osteoporosis and fractures, ask your health care provider if you should be screened.  Ask your health care provider whether you should take a calcium or vitamin D supplement to lower your risk for osteoporosis.  Menopause may have certain physical symptoms and risks.  Hormone replacement therapy may reduce some of these symptoms and risks. Talk to your health care provider about whether hormone replacement therapy is right for you. Follow these instructions at home:  Schedule regular health, dental, and eye exams.  Stay current with your immunizations.  Do not use any tobacco products including cigarettes, chewing tobacco, or electronic cigarettes.  If you are pregnant, do not drink alcohol.  If you are breastfeeding, limit how much and how often you drink  alcohol.  Limit alcohol intake to no more than 1 drink per day for nonpregnant women. One drink equals 12 ounces of beer, 5 ounces of wine, or 1 ounces of hard liquor.  Do not use street drugs.  Do not share needles.  Ask your health care provider for help if you need support or information about quitting drugs.  Tell your health care provider if you often feel depressed.  Tell your health care provider if you have ever been abused or do not feel safe at home. This information is not intended to replace advice given to you by your health care provider. Make sure you discuss any questions you have with your health care provider. Document Released: 07/21/2010 Document Revised: 06/13/2015 Document Reviewed: 10/09/2014 Elsevier Interactive Patient Education  [employer].

## 2016-10-21 NOTE — Addendum Note (Signed)
Addended by: Abbe Amsterdam C on: 10/21/2016 07:03 PM   Modules accepted: Orders

## 2016-11-02 ENCOUNTER — Encounter: Payer: Self-pay | Admitting: Family Medicine

## 2016-11-04 DIAGNOSIS — Z8669 Personal history of other diseases of the nervous system and sense organs: Secondary | ICD-10-CM | POA: Diagnosis not present

## 2016-11-04 DIAGNOSIS — R51 Headache: Secondary | ICD-10-CM | POA: Diagnosis not present

## 2016-11-04 DIAGNOSIS — G4710 Hypersomnia, unspecified: Secondary | ICD-10-CM | POA: Diagnosis not present

## 2016-11-04 DIAGNOSIS — G4733 Obstructive sleep apnea (adult) (pediatric): Secondary | ICD-10-CM | POA: Diagnosis not present

## 2016-11-09 ENCOUNTER — Encounter: Payer: Self-pay | Admitting: Family Medicine

## 2016-11-09 DIAGNOSIS — Z1212 Encounter for screening for malignant neoplasm of rectum: Secondary | ICD-10-CM | POA: Diagnosis not present

## 2016-11-09 DIAGNOSIS — Z1211 Encounter for screening for malignant neoplasm of colon: Secondary | ICD-10-CM | POA: Diagnosis not present

## 2016-11-09 LAB — COLOGUARD

## 2016-11-10 ENCOUNTER — Encounter: Payer: Self-pay | Admitting: Family Medicine

## 2016-11-10 DIAGNOSIS — E875 Hyperkalemia: Secondary | ICD-10-CM

## 2016-11-10 DIAGNOSIS — E2839 Other primary ovarian failure: Secondary | ICD-10-CM

## 2016-11-12 ENCOUNTER — Other Ambulatory Visit: Payer: Self-pay | Admitting: Family Medicine

## 2016-11-12 ENCOUNTER — Ambulatory Visit (HOSPITAL_BASED_OUTPATIENT_CLINIC_OR_DEPARTMENT_OTHER)
Admission: RE | Admit: 2016-11-12 | Discharge: 2016-11-12 | Disposition: A | Discharge status: Home / Self Care | Payer: BLUE CROSS/BLUE SHIELD | Source: Ambulatory Visit | Attending: Family Medicine | Admitting: Family Medicine

## 2016-11-12 ENCOUNTER — Other Ambulatory Visit (INDEPENDENT_AMBULATORY_CARE_PROVIDER_SITE_OTHER): Payer: BLUE CROSS/BLUE SHIELD

## 2016-11-12 DIAGNOSIS — Z780 Asymptomatic menopausal state: Secondary | ICD-10-CM | POA: Diagnosis not present

## 2016-11-12 DIAGNOSIS — E875 Hyperkalemia: Secondary | ICD-10-CM

## 2016-11-12 DIAGNOSIS — E2839 Other primary ovarian failure: Secondary | ICD-10-CM

## 2016-11-12 DIAGNOSIS — M85851 Other specified disorders of bone density and structure, right thigh: Secondary | ICD-10-CM | POA: Diagnosis not present

## 2016-11-12 DIAGNOSIS — M8580 Other specified disorders of bone density and structure, unspecified site: Secondary | ICD-10-CM | POA: Diagnosis not present

## 2016-11-12 LAB — BASIC METABOLIC PANEL
BUN: 12 mg/dL (ref 7–25)
CHLORIDE: 103 mmol/L (ref 98–110)
CO2: 28 mmol/L (ref 20–32)
CREATININE: 0.76 mg/dL (ref 0.50–1.05)
Calcium: 9.8 mg/dL (ref 8.6–10.4)
Glucose, Bld: 84 mg/dL (ref 65–99)
POTASSIUM: 5.8 mmol/L — AB (ref 3.5–5.3)
SODIUM: 139 mmol/L (ref 135–146)

## 2016-11-13 ENCOUNTER — Other Ambulatory Visit: Payer: Self-pay | Admitting: Family Medicine

## 2016-11-13 ENCOUNTER — Encounter: Payer: Self-pay | Admitting: Family Medicine

## 2016-11-13 DIAGNOSIS — E875 Hyperkalemia: Secondary | ICD-10-CM

## 2016-11-15 NOTE — Progress Notes (Signed)
Have communicated with pt regarding her K- she is not using any NSAIDs, no other obvious cause of hyperkalemia.  She will watch her diet- given list of high K foods- and come in for a repeat this coming week

## 2016-11-20 ENCOUNTER — Telehealth: Payer: Self-pay | Admitting: *Deleted

## 2016-11-20 NOTE — Telephone Encounter (Signed)
Received results from Cologuard; forwarded to provider/SLS 11/02

## 2016-11-21 NOTE — Progress Notes (Signed)
[facility] [address], [address] [place], [place] [address] [phone] [phone]  Date:  11/23/2016   Name:  Lindsay Neal   DOB:  03/13/1965   MRN:  [id number]  PCP:  Pearline Cablesopland, Risa Auman C, MD    Chief Complaint: Hip Pain (Pt here for another hip injection pt reports  aching feeling in rigth thigh and calf )   History of Present Illness:  Lindsay Neal is a 51 y.o. very pleasant female patient who presents with the following:  Here today to discuss a few concerns Heeds to recheck K today  We last injected her right GT bursa in June, and also in February of this year  The injection in June helped for several months, "but then it came back" recently after she did a lot of walking at a fall festival We did some hip films back in June which looked ok She has pain more running down her right leg on the front- but no numbness or tingling of the leg Last mammo- earlier this year, looks ok No redness or swelling of her hip This is similar to pain she had in the past that responded well to injection  Lab Results  Component Value Date   TSH 1.56 10/21/2016       Chemistry      Component Value Date/Time   NA 139 11/12/2016 1555   NA 143 02/25/2015   K 5.8 (H) 11/12/2016 1555   CL 103 11/12/2016 1555   CO2 28 11/12/2016 1555   BUN 12 11/12/2016 1555   BUN 13 02/25/2015   CREATININE 0.76 11/12/2016 1555   GLU 83 02/25/2015      Component Value Date/Time   CALCIUM 9.8 11/12/2016 1555   ALKPHOS 63 10/21/2016 1002   AST 24 10/21/2016 1002   ALT 15 10/21/2016 1002   BILITOT 0.4 10/21/2016 1002       Patient Active Problem List   Diagnosis Date Noted  . Right hip pain 03/05/2016  . Hypothyroidism due to acquired atrophy of thyroid 02/20/2016  . Status post bariatric surgery 02/20/2016  . 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 GERD (gastroesophageal reflux disease) 02/22/2013  . Dysmetabolic syndrome  X 04/19/2012  . Allergic rhinitis 02/23/2012  . Depression 02/23/2012  . Sleep apnea, obstructive 02/23/2012    Past Medical History:  Diagnosis Date  . Arthritis   . Frequent headaches   . GERD (gastroesophageal reflux disease)   . High cholesterol   . HPV (human papilloma virus) infection   . Sleep apnea   . Urine incontinence     Past Surgical History:  Procedure Laterality Date  . CESAREAN SECTION  04/13/1991  . GALLBLADDER SURGERY  10/08/2014  . GASTRIC BYPASS  10/08/2014  . PLANTAR FASCIA SURGERY  2015  . SHOULDER SURGERY Right 2014  . VAGINA SURGERY      Social History   Tobacco Use  . Smoking status: Never Smoker  . Smokeless tobacco: Never Used  Substance Use Topics  . Alcohol use: No  . Drug use: Not on file    Family History  Problem Relation Age of Onset  . Arthritis Mother   . Diabetes Mother   . Elevated Lipids Mother   . Elevated Lipids Father   . Heart disease Father   . Stroke Father   . Hypertension Father   . Heart disease Brother   . Colon cancer Maternal Grandmother   .  Diabetes Maternal Grandmother   . Elevated Lipids Maternal Grandfather   . Arthritis Paternal Grandmother   . Hypertension Paternal Grandmother     Allergies  Allergen Reactions  . Penicillins   . Sulfa Antibiotics     Medication list has been reviewed and updated.  Current Outpatient Medications on File Prior to Visit  Medication Sig Dispense Refill  . Cyanocobalamin (B-12) 1000 MCG LOZG Take by mouth.    . escitalopram (LEXAPRO) 10 MG tablet Take 1 tablet (10 mg total) by mouth daily. 90 tablet 3  . esomeprazole (NEXIUM) 40 MG capsule TAKE 1 CAPSULE BY MOUTH 2 TIMES DAILY BEFORE MEALS.    . fenofibrate 160 MG tablet     . IRON PO Take 65 mg by mouth daily.    Marland Kitchen levothyroxine (SYNTHROID, LEVOTHROID) 75 MCG tablet TAKE 1 TABLET DAILY.    . multivitamin-iron-minerals-folic acid (CENTRUM) chewable tablet Chew by mouth.     No current facility-administered  medications on file prior to visit.     Review of Systems:  As per HPI- otherwise negative.   Physical Examination: Vitals:   11/23/16 1538  BP: 116/72  Pulse: 66  Resp: 16  Temp: 98 F (36.7 C)  SpO2: 100%   Vitals:   11/23/16 1538  Weight: 152 lb (68.9 kg)   Body mass index is 26.09 kg/m. Ideal Body Weight:    GEN: WDWN, NAD, Non-toxic, A & O x 3, mild overweight, looks well HEENT: Atraumatic, Normocephalic. Neck supple. No masses, No LAD. Ears and Nose: No external deformity. CV: RRR, No M/G/R. No JVD. No thrill. No extra heart sounds. PULM: CTA B, no wheezes, crackles, rhonchi. No retractions. No resp. distress. No accessory muscle use. ABD: S, NT, ND, +BS. No rebound. No HSM. EXTR: No c/c/e NEURO Normal gait.  PSYCH: Normally interactive. Conversant. Not depressed or anxious appearing.  Calm demeanor.  Right hip: she notes tenderness at the greater trochanteric bursa. Normal ROM of the hip, no redness,swelling or heat   VC obtained. Prepared 4ml of 1% lidocaine and 40 mg of depo-medrol for injection Right hip prepped with betadine and alcohol. Injected steroid mixture into right trochanteric bursa- pt tolerated well, no complications Assessment and Plan: Trochanteric bursitis of right hip  Hyperkalemia - Plan: Basic metabolic panel  Injection of trochanteric bursa as above BMP pending today   Signed Abbe Amsterdam, MD

## 2016-11-23 ENCOUNTER — Encounter: Payer: Self-pay | Admitting: Family Medicine

## 2016-11-23 ENCOUNTER — Ambulatory Visit (INDEPENDENT_AMBULATORY_CARE_PROVIDER_SITE_OTHER): Payer: BLUE CROSS/BLUE SHIELD | Admitting: Family Medicine

## 2016-11-23 VITALS — BP 116/72 | HR 66 | Temp 98.0°F | Resp 16 | Wt 152.0 lb

## 2016-11-23 DIAGNOSIS — F325 Major depressive disorder, single episode, in full remission: Secondary | ICD-10-CM

## 2016-11-23 DIAGNOSIS — E875 Hyperkalemia: Secondary | ICD-10-CM | POA: Diagnosis not present

## 2016-11-23 DIAGNOSIS — M7061 Trochanteric bursitis, right hip: Secondary | ICD-10-CM

## 2016-11-23 NOTE — Patient Instructions (Signed)
I hope your hip feels better!  Let me know if any sign of infection (redness, heat, swelling, pus) I will be in touch with your blood work asap

## 2016-11-24 ENCOUNTER — Encounter: Payer: Self-pay | Admitting: Family Medicine

## 2016-11-24 LAB — BASIC METABOLIC PANEL
BUN: 15 mg/dL (ref 6–23)
CHLORIDE: 103 meq/L (ref 96–112)
CO2: 29 mEq/L (ref 19–32)
Calcium: 9.4 mg/dL (ref 8.4–10.5)
Creatinine, Ser: 0.67 mg/dL (ref 0.40–1.20)
GFR: 98.79 mL/min (ref >60.00)
Glucose, Bld: 86 mg/dL (ref 70–99)
POTASSIUM: 4.1 meq/L (ref 3.5–5.1)
Sodium: 140 mEq/L (ref 135–145)

## 2016-11-24 MED ORDER — ESCITALOPRAM OXALATE 10 MG PO TABS: 10.0000 mg | ORAL_TABLET | Freq: Every day | ORAL | 3 refills | Status: DC

## 2016-11-24 MED ORDER — METHYLPREDNISOLONE ACETATE 40 MG/ML IJ SUSP: 40.0000 mg | Freq: Once | INTRAMUSCULAR | Status: DC

## 2016-11-24 NOTE — Addendum Note (Signed)
Addended by: Abbe AmsterdamOPLAND, JESSICA C on: 11/24/2016 03:34 PM   Modules accepted: Orders

## 2016-12-15 ENCOUNTER — Encounter: Payer: Self-pay | Admitting: Family Medicine

## 2016-12-15 DIAGNOSIS — Z1211 Encounter for screening for malignant neoplasm of colon: Secondary | ICD-10-CM

## 2016-12-16 NOTE — Addendum Note (Signed)
Addended by: Abbe AmsterdamOPLAND, Burhanuddin Kohlmann C on: 12/16/2016 08:34 AM   Modules accepted: Orders

## 2016-12-22 DIAGNOSIS — N3090 Cystitis, unspecified without hematuria: Secondary | ICD-10-CM | POA: Diagnosis not present

## 2016-12-22 DIAGNOSIS — Y92410 Unspecified street and highway as the place of occurrence of the external cause: Secondary | ICD-10-CM | POA: Diagnosis not present

## 2016-12-22 DIAGNOSIS — S60812A Abrasion of left wrist, initial encounter: Secondary | ICD-10-CM | POA: Diagnosis not present

## 2016-12-22 DIAGNOSIS — Y999 Unspecified external cause status: Secondary | ICD-10-CM | POA: Diagnosis not present

## 2016-12-22 DIAGNOSIS — S36892A Contusion of other intra-abdominal organs, initial encounter: Secondary | ICD-10-CM | POA: Diagnosis not present
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
## 2017-01-07 ENCOUNTER — Encounter: Payer: Self-pay | Admitting: Family Medicine

## 2017-01-07 ENCOUNTER — Ambulatory Visit: Payer: BLUE CROSS/BLUE SHIELD | Admitting: Family Medicine

## 2017-01-07 ENCOUNTER — Ambulatory Visit (HOSPITAL_BASED_OUTPATIENT_CLINIC_OR_DEPARTMENT_OTHER)
Admission: RE | Admit: 2017-01-07 | Discharge: 2017-01-07 | Disposition: A | Discharge status: Home / Self Care | Payer: BLUE CROSS/BLUE SHIELD | Source: Ambulatory Visit | Attending: Family Medicine | Admitting: Family Medicine

## 2017-01-07 VITALS — BP 100/70 | HR 74 | Temp 97.6°F | Resp 16 | Ht 64.0 in | Wt 151.4 lb

## 2017-01-07 DIAGNOSIS — M545 Low back pain, unspecified: Secondary | ICD-10-CM

## 2017-01-07 DIAGNOSIS — M47816 Spondylosis without myelopathy or radiculopathy, lumbar region: Secondary | ICD-10-CM | POA: Diagnosis not present

## 2017-01-07 DIAGNOSIS — M546 Pain in thoracic spine: Secondary | ICD-10-CM | POA: Diagnosis not present

## 2017-01-07 MED ORDER — CYCLOBENZAPRINE HCL 10 MG PO TABS
10.0000 mg | ORAL_TABLET | Freq: Two times a day (BID) | ORAL | 0 refills | Status: DC | PRN
Start: 2017-01-07 — End: 2017-03-04

## 2017-01-07 NOTE — Patient Instructions (Signed)
Please go to the ground floor to have x-rays of your back. Then you can head home and I will be in touch with your results asap Use the flexeril as needed- however please remember that this can make you sleepy, do not use it when you need to drive   Even if your films are ok, please alert me if your back pain is not improving over the next week or so- Sooner if worse.

## 2017-01-15 ENCOUNTER — Encounter: Payer: Self-pay | Admitting: Family Medicine

## 2017-01-27 ENCOUNTER — Encounter: Payer: Self-pay | Admitting: Family Medicine

## 2017-01-28 MED ORDER — FLUCONAZOLE 150 MG PO TABS: 150.0000 mg | ORAL_TABLET | Freq: Once | ORAL | 0 refills | Status: AC

## 2017-01-28 NOTE — Addendum Note (Signed)
Addended by: Abbe AmsterdamOPLAND, JESSICA C on: 01/28/2017 12:53 PM   Modules accepted: Orders

## 2017-02-10 ENCOUNTER — Ambulatory Visit: Payer: BLUE CROSS/BLUE SHIELD | Admitting: Medical

## 2017-02-10 ENCOUNTER — Encounter: Payer: Self-pay | Admitting: Medical

## 2017-02-10 VITALS — BP 105/66 | HR 70 | Temp 97.7°F | Resp 16 | Ht 64.0 in | Wt 151.4 lb

## 2017-02-10 DIAGNOSIS — Z01411 Encounter for gynecological examination (general) (routine) with abnormal findings: Secondary | ICD-10-CM | POA: Diagnosis not present

## 2017-02-10 DIAGNOSIS — J069 Acute upper respiratory infection, unspecified: Secondary | ICD-10-CM

## 2017-02-10 DIAGNOSIS — R87810 Cervical high risk human papillomavirus (HPV) DNA test positive: Secondary | ICD-10-CM | POA: Diagnosis not present

## 2017-02-10 DIAGNOSIS — M8580 Other specified disorders of bone density and structure, unspecified site: Secondary | ICD-10-CM | POA: Diagnosis not present

## 2017-02-10 MED ORDER — FLUTICASONE PROPIONATE 50 MCG/ACT NA SUSP: 2.0000 | Freq: Every day | NASAL | 1 refills | Status: DC

## 2017-02-10 MED ORDER — AZITHROMYCIN 250 MG PO TABS: ORAL_TABLET | ORAL | 0 refills | Status: DC

## 2017-02-10 MED ORDER — BENZONATATE 100 MG PO CAPS: 100.0000 mg | ORAL_CAPSULE | Freq: Three times a day (TID) | ORAL | 0 refills | Status: DC | PRN

## 2017-02-10 NOTE — Patient Instructions (Addendum)
You appear to have upper respiratory infection presently.  I would recommend symptomatic treatment at this time.  Rest and keep yourself hydrated.  We will provide prescription of benzonatate for cough and Flonase for nasal congestion.  We had a discussion about your length of illness and you might possibly develop signs of sinus infection or develop more symptoms consistent with bronchitis.  I  do not think antibiotic is indicated at this time.  But I did make a azithromycin antibiotic available at your pharmacy in the event you have worsening bacterial infectious signs and symptoms as discussed.    Follow-up in 7 days or as needed.

## 2017-02-10 NOTE — Progress Notes (Signed)
Subjective:    Patient ID: Lindsay Neal, female    DOB: 06/28/1965, 52 y.o.   MRN: [id number]  HPI  Pt in feeling sick since Thursday. First st then got nasal congestion. Now some cough intermittent but not severe. Pt reports clear drainage but no sinus pain.   No fever. Pt had some chills and faint achy on Monday. But no diffuse severe achiness. Pt did get flu vaccine. Pt prior achiness resolved.  No history of chronic sinus infections and non smoker.    Review of Systems  Constitutional: Negative for chills, fatigue and fever.       Some chills early on.  HENT: Positive for congestion and rhinorrhea. Negative for sinus pressure and sinus pain.   Respiratory: Positive for cough. Negative for chest tightness, shortness of breath and wheezing.   Cardiovascular: Negative for chest pain and palpitations.  Gastrointestinal: Negative for abdominal pain, diarrhea, nausea and vomiting.  Musculoskeletal: Negative for back pain and myalgias.  Skin: Negative for rash.  Neurological: Negative for dizziness, speech difficulty, light-headedness and headaches.  Hematological: Negative for adenopathy. Does not bruise/bleed easily.  Psychiatric/Behavioral: Negative for behavioral problems, confusion and dysphoric mood. The patient is not nervous/anxious.    Past Medical History:  Diagnosis Date  . Arthritis   . Frequent headaches   . GERD (gastroesophageal reflux disease)   . High cholesterol   . HPV (human papilloma virus) infection   . Sleep apnea   . Urine incontinence      Social History   Socioeconomic History  . Marital status: Married    Spouse name: Not on file  . Number of children: Not on file  . Years of education: Not on file  . Highest education level: Not on file  Social Needs  . Financial resource strain: Not on file  . Food insecurity - worry: Not on file  . Food insecurity - inability: Not on file  . Transportation needs - medical: Not on file  . Transportation  needs - non-medical: Not on file  Occupational History  . Not on file  Tobacco Use  . Smoking status: Never Smoker  . Smokeless tobacco: Never Used  Substance and Sexual Activity  . Alcohol use: No  . Drug use: Not on file  . Sexual activity: Not on file  Other Topics Concern  . Not on file  Social History Narrative  . Not on file    Past Surgical History:  Procedure Laterality Date  . CESAREAN SECTION  04/13/1991  . GALLBLADDER SURGERY  10/08/2014  . GASTRIC BYPASS  10/08/2014  . PLANTAR FASCIA SURGERY  2015  . SHOULDER SURGERY Right 2014  . VAGINA SURGERY      Family History  Problem Relation Age of Onset  . Arthritis Mother   . Diabetes Mother   . Elevated Lipids Mother   . Elevated Lipids Father   . Heart disease Father   . Stroke Father   . Hypertension Father   . Heart disease Brother   . Colon cancer Maternal Grandmother   . Diabetes Maternal Grandmother   . Elevated Lipids Maternal Grandfather   . Arthritis Paternal Grandmother   . Hypertension Paternal Grandmother     Allergies  Allergen Reactions  . Penicillins   . Sulfa Antibiotics     Current Outpatient Medications on File Prior to Visit  Medication Sig Dispense Refill  . calcium citrate-vitamin D (CITRACAL+D) 315-200 MG-UNIT tablet Take 1 tablet by mouth daily.    .Marland Kitchen  Cyanocobalamin (B-12) 1000 MCG LOZG Take by mouth.    . cyclobenzaprine (FLEXERIL) 10 MG tablet Take 1 tablet (10 mg total) by mouth 2 (two) times daily as needed for muscle spasms. 30 tablet 0  . escitalopram (LEXAPRO) 10 MG tablet Take 1 tablet (10 mg total) daily by mouth. 90 tablet 3  . esomeprazole (NEXIUM) 40 MG capsule TAKE 1 CAPSULE BY MOUTH 2 TIMES DAILY BEFORE MEALS.    . fenofibrate 160 MG tablet     . IRON PO Take 65 mg by mouth daily.    Marland Kitchen levothyroxine (SYNTHROID, LEVOTHROID) 75 MCG tablet TAKE 1 TABLET DAILY.    . multivitamin-iron-minerals-folic acid (CENTRUM) chewable tablet Chew by mouth.     Current  Facility-Administered Medications on File Prior to Visit  Medication Dose Route Frequency Provider Last Rate Last Dose  . methylPREDNISolone acetate (DEPO-MEDROL) injection 40 mg  40 mg Intra-articular Once Copland, Jessica C, MD        BP 105/66 (BP Location: Left Arm, Patient Position: Sitting, Cuff Size: Small)   Pulse 70   Temp 97.7 F (36.5 C) (Oral)   Resp 16   Ht 5\' 4"  (1.626 m)   Wt 151 lb 6.4 oz (68.7 kg)   SpO2 100%   BMI 25.99 kg/m       Objective:   Physical Exam  General  Mental Status - Alert. General Appearance - Well groomed. Not in acute distress.  Skin Rashes- No Rashes.  HEENT Head- Normal. Ear Auditory Canal - Left- Normal. Right - Normal.Tympanic Membrane- Left- Normal. Right- Normal. Eye Sclera/Conjunctiva- Left- Normal. Right- Normal. Nose & Sinuses Nasal Mucosa- Left-  Boggy and Congested. Right-  Boggy and  Congested.Bilateral no  maxillary and  No frontal sinus pressure. Mouth & Throat Lips: Upper Lip- Normal: no dryness, cracking, pallor, cyanosis, or vesicular eruption. Lower Lip-Normal: no dryness, cracking, pallor, cyanosis or vesicular eruption. Buccal Mucosa- Bilateral- No Aphthous ulcers. Oropharynx- No Discharge or Erythema. +pnd Tonsils: Characteristics- Bilateral- No Erythema or Congestion. Size/Enlargement- Bilateral- No enlargement. Discharge- bilateral-None.  Neck Neck- Supple. No Masses.   Chest and Lung Exam Auscultation: Breath Sounds:-Clear even and unlabored.  Cardiovascular Auscultation:Rythm- Regular, rate and rhythm. Murmurs & Other Heart Sounds:Ausculatation of the heart reveal- No Murmurs.  Lymphatic Head & Neck General Head & Neck Lymphatics: Bilateral: Description- No Localized lymphadenopathy.       Assessment & Plan:  You appear to have upper respiratory infection presently.  I would recommend symptomatic treatment at this time.  Rest and keep yourself hydrated.  We will provide prescription of  benzonatate for cough and Flonase for nasal congestion.  We had a discussion about your length of illness and you might possibly develop signs of sinus infection or develop more symptoms consistent with bronchitis.  I  do not think antibiotic is indicated at this time.  But I did make a azithromycin antibiotic available at your pharmacy in the event you have worsening bacterial infectious signs and symptoms as discussed.  Follow-up in 7 days or as needed  Malahki Gasaway, Ramon Dredge, [employer]

## 2017-02-11 ENCOUNTER — Ambulatory Visit: Payer: BLUE CROSS/BLUE SHIELD | Admitting: Family

## 2017-02-16 ENCOUNTER — Telehealth: Payer: Self-pay | Admitting: Medical

## 2017-02-16 NOTE — Telephone Encounter (Signed)
Copied from CRM [phone]. Topic: Quick Communication - See Telephone Encounter >> Feb 16, 2017 10:05 AM Eston Mouldavis, Carolanne Mercier B wrote: CRM for notification. See Telephone encounter for:   Pt was in office 1/23 and received medication,  she is now in need of the antibiotic that Esperanza RichtersEdward Saguier told her he would have on hold for her...she needs it sent to : [facility] [place] [place] - [address] [phone] ([phone] (Fax)    02/16/17.

## 2017-02-16 NOTE — Telephone Encounter (Signed)
I would recommend that patient be seen again.  Most of her symptoms seem like upper respiratory infection at the time that I saw her.  And I gave her a azithromycin in the event that she got worse.  If you review the telephone notes she sent the message stating I would send in an antibiotic if she got worse.  Since she has already taken the antibiotic I think she needs reevaluation.  The body aches at this time of the year could be early flu.  At that time I saw her she had some faint achiness only on Monday.  But the description was not flulike.  Does he have any other symptoms other than body aches?  Try to give her a convenient appointment over the next 2 days may be 1-2 tomorrow afternoon or sometime between 1 and 2 on Thursday.

## 2017-02-16 NOTE — Telephone Encounter (Signed)
Pt finished z pack states she feels achy again today.

## 2017-02-16 NOTE — Telephone Encounter (Signed)
I did prescribe antibiotic on review of chart on the 23rd.  The antibiotic was a azithromycin.  Please notify patient that it was sent in on the day of service.  Check and see if it is the correct pharmacy that she now wants medicine sent to.  If she is giving different pharmacy now than cancel previous prescription and resend to new pharmacy.

## 2017-02-17 NOTE — Telephone Encounter (Signed)
  Pt states she will se how she feels when she gets off and will call back

## 2017-02-23 ENCOUNTER — Encounter: Payer: Self-pay | Admitting: Family Medicine

## 2017-02-26 DIAGNOSIS — K219 Gastro-esophageal reflux disease without esophagitis: Secondary | ICD-10-CM | POA: Diagnosis not present

## 2017-02-26 DIAGNOSIS — M859 Disorder of bone density and structure, unspecified: Secondary | ICD-10-CM | POA: Diagnosis not present

## 2017-02-26 DIAGNOSIS — E039 Hypothyroidism, unspecified: Secondary | ICD-10-CM | POA: Diagnosis not present

## 2017-02-26 DIAGNOSIS — Z9884 Bariatric surgery status: Secondary | ICD-10-CM | POA: Diagnosis not present

## 2017-02-26 DIAGNOSIS — F329 Major depressive disorder, single episode, unspecified: Secondary | ICD-10-CM | POA: Diagnosis not present

## 2017-02-26 DIAGNOSIS — M8580 Other specified disorders of bone density and structure, unspecified site: Secondary | ICD-10-CM | POA: Insufficient documentation

## 2017-03-01 ENCOUNTER — Encounter: Payer: Self-pay | Admitting: Family Medicine
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
## 2017-03-04 ENCOUNTER — Encounter: Payer: Self-pay | Admitting: Family Medicine

## 2017-03-04 ENCOUNTER — Ambulatory Visit (HOSPITAL_BASED_OUTPATIENT_CLINIC_OR_DEPARTMENT_OTHER)
Admission: RE | Admit: 2017-03-04 | Discharge: 2017-03-04 | Disposition: A | Discharge status: Home / Self Care | Payer: BLUE CROSS/BLUE SHIELD | Source: Ambulatory Visit | Attending: Family Medicine | Admitting: Family Medicine

## 2017-03-04 ENCOUNTER — Ambulatory Visit: Payer: BLUE CROSS/BLUE SHIELD | Admitting: Family Medicine

## 2017-03-04 VITALS — BP 108/70 | HR 77 | Temp 98.2°F | Resp 16 | Ht 63.5 in | Wt 150.0 lb

## 2017-03-04 DIAGNOSIS — M533 Sacrococcygeal disorders, not elsewhere classified: Secondary | ICD-10-CM | POA: Diagnosis not present

## 2017-03-04 DIAGNOSIS — M545 Low back pain, unspecified: Secondary | ICD-10-CM

## 2017-03-04 DIAGNOSIS — R102 Pelvic and perineal pain: Secondary | ICD-10-CM | POA: Diagnosis not present

## 2017-03-04 MED ORDER — MELOXICAM 7.5 MG PO TABS
7.5000 mg | ORAL_TABLET | Freq: Every day | ORAL | 0 refills | Status: DC
Start: 2017-03-04 — End: 2017-03-04

## 2017-03-04 MED ORDER — CYCLOBENZAPRINE HCL 10 MG PO TABS: 10.0000 mg | ORAL_TABLET | Freq: Every day | ORAL | 0 refills | Status: DC

## 2017-03-04 NOTE — Patient Instructions (Addendum)
Please go to the ground floor and have your x-rays Then you can head home, I will be in touch with your results I gave you an rx for flexeril- take one at bedtime as needed for pain in your lower back  If not improved/ resolved after 2 weeks please let me know- Sooner if worse.

## 2017-04-06 ENCOUNTER — Encounter: Payer: Self-pay | Admitting: Family Medicine

## 2017-04-06 DIAGNOSIS — M533 Sacrococcygeal disorders, not elsewhere classified: Secondary | ICD-10-CM

## 2017-04-16 ENCOUNTER — Encounter: Payer: Self-pay | Admitting: Medical

## 2017-04-16 ENCOUNTER — Telehealth: Payer: Self-pay | Admitting: Medical

## 2017-04-16 ENCOUNTER — Ambulatory Visit: Payer: BLUE CROSS/BLUE SHIELD | Admitting: Medical

## 2017-04-16 VITALS — BP 111/60 | HR 92 | Temp 98.4°F | Resp 16 | Ht 63.5 in | Wt 153.0 lb

## 2017-04-16 DIAGNOSIS — M791 Myalgia, unspecified site: Secondary | ICD-10-CM

## 2017-04-16 DIAGNOSIS — J029 Acute pharyngitis, unspecified: Secondary | ICD-10-CM | POA: Diagnosis not present

## 2017-04-16 DIAGNOSIS — R0981 Nasal congestion: Secondary | ICD-10-CM

## 2017-04-16 LAB — POCT INFLUENZA A/B
INFLUENZA A, POC: POSITIVE — AB
INFLUENZA B, POC: NEGATIVE

## 2017-04-16 LAB — POCT RAPID STREP A (OFFICE): RAPID STREP A SCREEN: NEGATIVE

## 2017-04-16 MED ORDER — OSELTAMIVIR PHOSPHATE 75 MG PO CAPS: 75.0000 mg | ORAL_CAPSULE | Freq: Two times a day (BID) | ORAL | 0 refills | Status: DC

## 2017-04-16 MED ORDER — AZITHROMYCIN 250 MG PO TABS: ORAL_TABLET | ORAL | 0 refills | Status: DC

## 2017-04-16 NOTE — Patient Instructions (Addendum)
Your symptoms and physical exam are consistent with strep throat despite the rapid test stating negative.  Sometimes rapid test can give false negative results.  I am prescribing a azithromycin antibiotic.  For cough you have benzonatate available at your house.  For recent nasal congestion, I would recommend using Flonase.  We did have upcoming allergy season related to the pollen so Flonase may come in handy.  The flu test initially looked negative.  But just after patient left medical assistant rechecked and there was a faint line positive type A.  Dr. Carmelia RollerWendling evaluated and thought same.  I read results positive as well.  So I called patient and informed her that I would also send in Tamiflu prescription and she will start that today as well.  I still think she likely has strep throat as well.  Follow-up in 7 days or as needed.

## 2017-04-16 NOTE — Progress Notes (Signed)
Subjective:    Patient ID: Lindsay Neal, female    DOB: 11/07/1965, 52 y.o.   MRN: [id number]  HPI  Pt in with severe st for one day. Pain swallowing own saliva. Pain shoot to ears. Pt has one grandchild.  Her throat pain was so severe last night she was crying  Low grade fever and chills with some body aches.   Pt states coworker in office had strep.But that person is in other end of office.    Review of Systems  Constitutional: Positive for fatigue and fever. Negative for chills.  HENT: Positive for congestion, ear pain and sore throat.   Respiratory: Positive for cough. Negative for shortness of breath and wheezing.        Mild cough.  Cardiovascular: Negative for chest pain and palpitations.  Gastrointestinal: Negative for abdominal pain.  Musculoskeletal: Positive for myalgias.  Skin: Negative for rash.  Neurological: Negative for dizziness, weakness, light-headedness and headaches.  Hematological: Positive for adenopathy.  Psychiatric/Behavioral: Negative for behavioral problems, confusion and sleep disturbance. The patient is not nervous/anxious.     Past Medical History:  Diagnosis Date  . Arthritis   . Frequent headaches   . GERD (gastroesophageal reflux disease)   . High cholesterol   . HPV (human papilloma virus) infection   . Sleep apnea   . Urine incontinence      Social History   Socioeconomic History  . Marital status: Married    Spouse name: Not on file  . Number of children: Not on file  . Years of education: Not on file  . Highest education level: Not on file  Occupational History  . Not on file  Social Needs  . Financial resource strain: Not on file  . Food insecurity:    Worry: Not on file    Inability: Not on file  . Transportation needs:    Medical: Not on file    Non-medical: Not on file  Tobacco Use  . Smoking status: Never Smoker  . Smokeless tobacco: Never Used  Substance and Sexual Activity  . Alcohol use: No  . Drug use: Not  on file  . Sexual activity: Not on file  Lifestyle  . Physical activity:    Days per week: Not on file    Minutes per session: Not on file  . Stress: Not on file  Relationships  . Social connections:    Talks on phone: Not on file    Gets together: Not on file    Attends religious service: Not on file    Active member of club or organization: Not on file    Attends meetings of clubs or organizations: Not on file    Relationship status: Not on file  . Intimate partner violence:    Fear of current or ex partner: Not on file    Emotionally abused: Not on file    Physically abused: Not on file    Forced sexual activity: Not on file  Other Topics Concern  . Not on file  Social History Narrative  . Not on file    Past Surgical History:  Procedure Laterality Date  . CESAREAN SECTION  04/13/1991  . GALLBLADDER SURGERY  10/08/2014  . GASTRIC BYPASS  10/08/2014  . PLANTAR FASCIA SURGERY  2015  . SHOULDER SURGERY Right 2014  . VAGINA SURGERY      Family History  Problem Relation Age of Onset  . Arthritis Mother   . Diabetes Mother   . Elevated  Lipids Mother   . Elevated Lipids Father   . Heart disease Father   . Stroke Father   . Hypertension Father   . Heart disease Brother   . Colon cancer Maternal Grandmother   . Diabetes Maternal Grandmother   . Elevated Lipids Maternal Grandfather   . Arthritis Paternal Grandmother   . Hypertension Paternal Grandmother     Allergies  Allergen Reactions  . Penicillins   . Sulfa Antibiotics     Current Outpatient Medications on File Prior to Visit  Medication Sig Dispense Refill  . benzonatate (TESSALON) 100 MG capsule Take 1 capsule (100 mg total) by mouth 3 (three) times daily as needed. 21 capsule 0  . calcium citrate-vitamin D (CITRACAL+D) 315-200 MG-UNIT tablet Take 1 tablet by mouth daily.    . Cholecalciferol (D3-1000) 1000 units tablet     . Cyanocobalamin (B-12) 1000 MCG LOZG Take by mouth.    . cyclobenzaprine  (FLEXERIL) 10 MG tablet Take 1 tablet (10 mg total) by mouth at bedtime. 30 tablet 0  . escitalopram (LEXAPRO) 10 MG tablet Take 1 tablet (10 mg total) daily by mouth. 90 tablet 3  . esomeprazole (NEXIUM) 40 MG capsule TAKE 1 CAPSULE BY MOUTH 2 TIMES DAILY BEFORE MEALS.    . fenofibrate 160 MG tablet     . fluticasone (FLONASE) 50 MCG/ACT nasal spray Place 2 sprays into both nostrils daily. 16 g 1  . IRON PO Take 65 mg by mouth daily.    Marland Kitchen levothyroxine (SYNTHROID, LEVOTHROID) 75 MCG tablet TAKE 1 TABLET DAILY.    . multivitamin-iron-minerals-folic acid (CENTRUM) chewable tablet Chew by mouth.     No current facility-administered medications on file prior to visit.     BP 111/60 (BP Location: Left Arm, Patient Position: Sitting, Cuff Size: Small)   Pulse 92   Temp 98.4 F (36.9 C) (Oral)   Resp 16   Ht 5' 3.5" (1.613 m)   Wt 153 lb (69.4 kg)   SpO2 100%   BMI 26.68 kg/m       Objective:   Physical Exam  General  Mental Status - Alert. General Appearance - Well groomed. Not in acute distress.  Skin Rashes- No Rashes.  HEENT Head- Normal. Ear Auditory Canal - Left- Normal. Right - Normal.Tympanic Membrane- Left- Normal. Right- Normal. Eye Sclera/Conjunctiva- Left- Normal. Right- Normal. Nose & Sinuses Nasal Mucosa- Left-  Boggy and Congested. Right-  Boggy and  Congested.Bilateral no maxillary and no frontal sinus pressure. Mouth & Throat Lips: Upper Lip- Normal: no dryness, cracking, pallor, cyanosis, or vesicular eruption. Lower Lip-Normal: no dryness, cracking, pallor, cyanosis or vesicular eruption. Buccal Mucosa- Bilateral- No Aphthous ulcers. Oropharynx- No Discharge or Erythema. Tonsils: Characteristics- Bilateral-moderate bright erythema . Size/Enlargement- Bilateral-1+ enlargement. Discharge- bilateral-None.  Neck Neck- Supple. No Masses.   Chest and Lung Exam Auscultation: Breath Sounds:-Clear even and unlabored.  Cardiovascular Auscultation:Rythm-  Regular, rate and rhythm. Murmurs & Other Heart Sounds:Ausculatation of the heart reveal- No Murmurs.  Lymphatic Head & Neck General Head & Neck Lymphatics: Bilateral: Description-moderate enlargement but they have your notes.       Assessment & Plan:  Your symptoms and physical exam are consistent with strep throat despite the rapid test stating negative.  Sometimes rapid test can give false negative results.  I am prescribing a azithromycin antibiotic.  For cough you have benzonatate available at your house.  For recent nasal congestion, I would recommend using Flonase.  We did have upcoming allergy season related to  the pollen so Flonase may come in handy.  The flu test initially looked negative.  But just after patient left medical assistant rechecked and there was a faint line positive type A.  Dr. Carmelia Roller evaluated and thought same.  I read results positive as well.  So I called patient and informed her that I would also send in Tamiflu prescription and she will start that today as well.  I still think she likely has strep throat as well.  Follow-up in 7 days or as needed.  Esperanza Richters, PA-C

## 2017-04-17 NOTE — Telephone Encounter (Signed)
Opened by accident

## 2017-04-17 NOTE — Telephone Encounter (Signed)
Opened to review 

## 2017-04-19 NOTE — Progress Notes (Signed)
Results changed to positive

## 2017-04-23 DIAGNOSIS — Z1231 Encounter for screening mammogram for malignant neoplasm of breast: Secondary | ICD-10-CM | POA: Diagnosis not present

## 2017-04-23 DIAGNOSIS — Z1239 Encounter for other screening for malignant neoplasm of breast: Secondary | ICD-10-CM | POA: Diagnosis not present

## 2017-05-13 ENCOUNTER — Encounter: Payer: Self-pay | Admitting: Family Medicine

## 2017-05-13 ENCOUNTER — Ambulatory Visit: Payer: BLUE CROSS/BLUE SHIELD | Admitting: Family Medicine

## 2017-05-13 VITALS — BP 106/69 | HR 68 | Temp 97.9°F | Ht 64.0 in | Wt 153.0 lb

## 2017-05-13 DIAGNOSIS — M7061 Trochanteric bursitis, right hip: Secondary | ICD-10-CM

## 2017-05-13 DIAGNOSIS — M25551 Pain in right hip: Secondary | ICD-10-CM | POA: Diagnosis not present

## 2017-05-13 MED ORDER — METHYLPREDNISOLONE ACETATE 40 MG/ML IJ SUSP
40.0000 mg | Freq: Once | INTRAMUSCULAR | Status: AC
  Administered 2017-05-13: 40 mg via INTRA_ARTICULAR

## 2017-05-13 NOTE — Patient Instructions (Signed)
We did an injection for your right hip bursa today Please let me know if not helpful or if any sign of infection such as redness, swelling, heat, or pain

## 2017-05-13 NOTE — Progress Notes (Signed)
[facility] [address], [address] [place], [place] [address] [phone] [phone]  Date:  05/13/2017   Name:  Lenox AhrConnie Waldridge   DOB:  06/26/1965   MRN:  [id number]  PCP:  Pearline Cablesopland, Jessica C, MD    Chief Complaint: Hip Pain (Pt here to have a hip injection. )   History of Present Illness:  Lenox AhrConnie Zawislak is a 52 y.o. very pleasant female patient who presents with the following:  She had an injection for her right hip trochanteric bursa back in November that did help her for several months.  Her sx have more recently come back and she would like a repeat  They are going on vacation this weekend and she will be walking quite a bit, and hopes to have better mobility and less pain with an injection  she is otherwise feeling well She did see ortho and was dx with coccyxdynia, but this is a different pain  She is looking forward to her vacation to [place] coming up  Patient Active Problem List   Diagnosis Date Noted  . Right hip pain 03/05/2016  . Hypothyroidism due to acquired atrophy of thyroid 02/20/2016  . Status post bariatric surgery 02/20/2016  . 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 GERD (gastroesophageal reflux disease) 02/22/2013  . Dysmetabolic syndrome X 04/19/2012  . Allergic rhinitis 02/23/2012  . Depression 02/23/2012  . Sleep apnea, obstructive 02/23/2012    Past Medical History:  Diagnosis Date  . Arthritis   . Frequent headaches   . GERD (gastroesophageal reflux disease)   . High cholesterol   . HPV (human papilloma virus) infection   . Sleep apnea   . Urine incontinence     Past Surgical History:  Procedure Laterality Date  . CESAREAN SECTION  04/13/1991  . GALLBLADDER SURGERY  10/08/2014  . GASTRIC BYPASS  10/08/2014  . PLANTAR FASCIA SURGERY  2015  . SHOULDER SURGERY Right 2014  . VAGINA SURGERY      Social History   Tobacco Use  . Smoking status: Never Smoker  .  Smokeless tobacco: Never Used  Substance Use Topics  . Alcohol use: No  . Drug use: Not on file    Family History  Problem Relation Age of Onset  . Arthritis Mother   . Diabetes Mother   . Elevated Lipids Mother   . Elevated Lipids Father   . Heart disease Father   . Stroke Father   . Hypertension Father   . Heart disease Brother   . Colon cancer Maternal Grandmother   . Diabetes Maternal Grandmother   . Elevated Lipids Maternal Grandfather   . Arthritis Paternal Grandmother   . Hypertension Paternal Grandmother     Allergies  Allergen Reactions  . Penicillins   . Sulfa Antibiotics     Medication list has been reviewed and updated.  Current Outpatient Medications on File Prior to Visit  Medication Sig Dispense Refill  . calcium citrate-vitamin D (CITRACAL+D) 315-200 MG-UNIT tablet Take 1 tablet by mouth daily.    . Cholecalciferol (D3-1000) 1000 units tablet     . Cyanocobalamin (B-12) 1000 MCG LOZG Take by mouth.    . cyclobenzaprine (FLEXERIL) 10 MG tablet Take 1 tablet (10 mg total) by mouth at bedtime. 30 tablet 0  . escitalopram (LEXAPRO) 10 MG tablet Take 1 tablet (10 mg total) daily by mouth. 90 tablet 3  . esomeprazole (NEXIUM) 40 MG capsule TAKE 1  CAPSULE BY MOUTH 2 TIMES DAILY BEFORE MEALS.    . fenofibrate 160 MG tablet     . IRON PO Take 65 mg by mouth daily.    Marland Kitchen levothyroxine (SYNTHROID, LEVOTHROID) 75 MCG tablet TAKE 1 TABLET DAILY.    . multivitamin-iron-minerals-folic acid (CENTRUM) chewable tablet Chew by mouth.     No current facility-administered medications on file prior to visit.     Review of Systems:  As per HPI- otherwise negative. No fever or chills No CP or SOB No history of diabetes   Physical Examination: Vitals:   05/13/17 1607  BP: 106/69  Pulse: 68  Temp: 97.9 F (36.6 C)  SpO2: 99%   Vitals:   05/13/17 1607  Weight: 153 lb (69.4 kg)  Height: 5\' 4"  (1.626 m)   Body mass index is 26.26 kg/m. Ideal Body Weight: Weight  in (lb) to have BMI = 25: 145.3  GEN: WDWN, NAD, Non-toxic, A & O x 3, looks well, normal weight  HEENT: Atraumatic, Normocephalic. Neck supple. No masses, No LAD. Ears and Nose: No external deformity. CV: RRR, No M/G/R. No JVD. No thrill. No extra heart sounds. PULM: CTA B, no wheezes, crackles, rhonchi. No retractions. No resp. distress. No accessory muscle use. EXTR: No c/c/e NEURO Normal gait.  PSYCH: Normally interactive. Conversant. Not depressed or anxious appearing.  Calm demeanor.  Right hip: she is tender over the greater trochanter, but otherwise hip shows normal ROM and no tenderness  Exam c/w greater trochanteric bursitis  VC obtained. Prepared 4ml of 1% lidocaine and 40 mg of depo-medrol for injection Right hip prepped with betadine and alcohol. Injected steroid mixture into right trochanteric bursa- pt tolerated well, no complications, no blood loss   Assessment and Plan: Trochanteric bursitis of right hip - Plan: methylPREDNISolone acetate (DEPO-MEDROL) injection 40 mg  Pain in joint of right hip  Hip injection today See patient instructions for more details.   She will let me know if any concerns   Signed Abbe Amsterdam, MD

## 2017-06-01 ENCOUNTER — Encounter: Payer: Self-pay | Admitting: Family Medicine

## 2017-07-09 ENCOUNTER — Ambulatory Visit: Payer: BLUE CROSS/BLUE SHIELD | Admitting: Medical

## 2017-07-09 ENCOUNTER — Ambulatory Visit (HOSPITAL_BASED_OUTPATIENT_CLINIC_OR_DEPARTMENT_OTHER)
Admission: RE | Admit: 2017-07-09 | Discharge: 2017-07-09 | Disposition: A | Discharge status: Home / Self Care | Payer: BLUE CROSS/BLUE SHIELD | Source: Ambulatory Visit | Attending: Medical | Admitting: Medical

## 2017-07-09 ENCOUNTER — Encounter: Payer: Self-pay | Admitting: Medical

## 2017-07-09 ENCOUNTER — Encounter: Payer: Self-pay | Admitting: Family Medicine

## 2017-07-09 VITALS — BP 104/60 | HR 63 | Temp 98.1°F | Resp 16 | Ht 64.0 in | Wt 155.6 lb

## 2017-07-09 DIAGNOSIS — R252 Cramp and spasm: Secondary | ICD-10-CM

## 2017-07-09 DIAGNOSIS — M7989 Other specified soft tissue disorders: Secondary | ICD-10-CM

## 2017-07-09 NOTE — Progress Notes (Signed)
Subjective:    Patient ID: Lindsay Neal, female    DOB: 1965-12-18, 52 y.o.   MRN: [id number]  HPI  Pt in with rt leg that feels swollen and feel like cramping sensation in foot last week. Mostly at night only.  Also some swelling to rt lower leg past week. Pt has no trauma or fall. No long car trips or travel. Non smoker. Not on any hormone therapy. No history of any dvt. No itching, insect bite or rash noted.  Pt just started tramadol for lower coccyx pain.  No shortness of breath. No orthopnea. Weight stable over past 2 months.    Review of Systems  Constitutional: Negative for chills, fatigue and fever.  HENT: Negative for congestion, drooling, ear pain and facial swelling.   Respiratory: Negative for chest tightness, shortness of breath, wheezing and stridor.   Cardiovascular: Negative for chest pain and palpitations.  Musculoskeletal: Negative for back pain, gait problem and joint swelling.       See hpi.  Skin: Negative for rash.  Neurological: Negative for dizziness, syncope, weakness and headaches.  Hematological: Negative for adenopathy. Does not bruise/bleed easily.  Psychiatric/Behavioral: Negative for behavioral problems and confusion.    Past Medical History:  Diagnosis Date  . Arthritis   . Frequent headaches   . GERD (gastroesophageal reflux disease)   . High cholesterol   . HPV (human papilloma virus) infection   . Sleep apnea   . Urine incontinence      Social History   Socioeconomic History  . Marital status: Married    Spouse name: Not on file  . Number of children: Not on file  . Years of education: Not on file  . Highest education level: Not on file  Occupational History  . Not on file  Social Needs  . Financial resource strain: Not on file  . Food insecurity:    Worry: Not on file    Inability: Not on file  . Transportation needs:    Medical: Not on file    Non-medical: Not on file  Tobacco Use  . Smoking status: Never Smoker  .  Smokeless tobacco: Never Used  Substance and Sexual Activity  . Alcohol use: No  . Drug use: Not on file  . Sexual activity: Not on file  Lifestyle  . Physical activity:    Days per week: Not on file    Minutes per session: Not on file  . Stress: Not on file  Relationships  . Social connections:    Talks on phone: Not on file    Gets together: Not on file    Attends religious service: Not on file    Active member of club or organization: Not on file    Attends meetings of clubs or organizations: Not on file    Relationship status: Not on file  . Intimate partner violence:    Fear of current or ex partner: Not on file    Emotionally abused: Not on file    Physically abused: Not on file    Forced sexual activity: Not on file  Other Topics Concern  . Not on file  Social History Narrative  . Not on file    Past Surgical History:  Procedure Laterality Date  . CESAREAN SECTION  04/13/1991  . GALLBLADDER SURGERY  10/08/2014  . GASTRIC BYPASS  10/08/2014  . PLANTAR FASCIA SURGERY  2015  . SHOULDER SURGERY Right 2014  . VAGINA SURGERY      Family  History  Problem Relation Age of Onset  . Arthritis Mother   . Diabetes Mother   . Elevated Lipids Mother   . Elevated Lipids Father   . Heart disease Father   . Stroke Father   . Hypertension Father   . Heart disease Brother   . Colon cancer Maternal Grandmother   . Diabetes Maternal Grandmother   . Elevated Lipids Maternal Grandfather   . Arthritis Paternal Grandmother   . Hypertension Paternal Grandmother     Allergies  Allergen Reactions  . Penicillins   . Pollen Extract Other (See Comments)    Stuffy nose  . Sulfa Antibiotics     Current Outpatient Medications on File Prior to Visit  Medication Sig Dispense Refill  . calcium citrate-vitamin D (CITRACAL+D) 315-200 MG-UNIT tablet Take 1 tablet by mouth daily.    . Cholecalciferol (D3-1000) 1000 units tablet     . Cyanocobalamin (B-12) 1000 MCG LOZG Take by mouth.     . cyclobenzaprine (FLEXERIL) 10 MG tablet Take 1 tablet (10 mg total) by mouth at bedtime. 30 tablet 0  . escitalopram (LEXAPRO) 10 MG tablet Take 1 tablet (10 mg total) daily by mouth. 90 tablet 3  . esomeprazole (NEXIUM) 40 MG capsule TAKE 1 CAPSULE BY MOUTH 2 TIMES DAILY BEFORE MEALS.    . fenofibrate 160 MG tablet     . IRON PO Take 65 mg by mouth daily.    Marland Kitchen. levothyroxine (SYNTHROID, LEVOTHROID) 75 MCG tablet TAKE 1 TABLET DAILY.    . multivitamin-iron-minerals-folic acid (CENTRUM) chewable tablet Chew by mouth.    . traMADol (ULTRAM) 50 MG tablet TAKE 1 TABLET BY MOUTH EVERY 8 HOURS AS NEEDED FOR UP TO 30 DAYS  2   No current facility-administered medications on file prior to visit.     BP 104/60   Pulse 63   Temp 98.1 F (36.7 C) (Oral)   Resp 16   Ht 5\' 4"  (1.626 m)   Wt 155 lb 9.6 oz (70.6 kg)   SpO2 100%   BMI 26.71 kg/m       Objective:   Physical Exam  General- No acute distress. Pleasant patient. Neck- Full range of motion, no jvd Lungs- Clear, even and unlabored. Heart- regular rate and rhythm. Neurologic- CNII- XII grossly intact.  Rt lower ext- mild-moderate swelling of rt left calf. No induration, on warmth, nontender to palpation. No edema. Negative homans signs.  Lt lower ext- no swelling, no warmth, nontender. Negative homans signs.       Assessment & Plan:  For right lower extremity leg swelling, I did place order for right lower extremity ultrasound to be done tonight.  You can go down now and get that done.  The results of study might take an hour and 1/2 to 2 hours.  It is Friday afternoon and I will be leaving the office before the read will come back.  If there is a DVT found then I want you to start Xarelto sample tab which I am giving you(instruction would be twice daily then send actual  rx in).  Very important only to start these tablets if I notify you of DVT after report back.  Otherwise do not use the samples.  Sometimes medications are  not well covered and extremely expensive with poor coverage.  I am making the samples available to avoid any problems over the weekend as you might start treatment.  For right lower extremity cramping, I placed order for metabolic  panel  and magnesium level.  We will let you know the results of those tests when they are in.  If signs and symptoms change or worsen please let us know.  Follow-up in 7 to 10 days or as needed.  Esperanza Richters, PA-C

## 2017-07-09 NOTE — Patient Instructions (Addendum)
For right lower extremity leg swelling, I did place order for right lower extremity ultrasound to be done tonight.  You can go down now and get that done.  The results of study might take an hour and 1/2 to 2 hours.  It is Friday afternoon and I will be leaving the office before the read will come back.  If there is a DVT found then I want you to start Xarelto sample tab which I am giving you(instruction would be twice daily then send actual  rx in).  Very important only to start these tablets if I notify you of DVT after report back.  Otherwise do not use the samples.  Sometimes medications are not well covered and extremely expensive with poor coverage.  I am making the samples available to avoid any problems over the weekend as you might start treatment.  For right lower extremity cramping, I placed order for metabolic  panel and magnesium level.  We will let you know the results of those tests when they are in.  If signs and symptoms change or worsen please let us know.  Follow-up in 7 to 10 days or as needed.

## 2017-07-10 LAB — COMPREHENSIVE METABOLIC PANEL
AG Ratio: 1.6 (calc) (ref 1.0–2.5)
ALBUMIN MSPROF: 4.1 g/dL (ref 3.6–5.1)
ALT: 19 U/L (ref 6–29)
AST: 25 U/L (ref 10–35)
Alkaline phosphatase (APISO): 70 U/L (ref 33–130)
BILIRUBIN TOTAL: 0.3 mg/dL (ref 0.2–1.2)
BUN: 21 mg/dL (ref 7–25)
CO2: 30 mmol/L (ref 20–32)
CREATININE: 0.74 mg/dL (ref 0.50–1.05)
Calcium: 9.6 mg/dL (ref 8.6–10.4)
Chloride: 106 mmol/L (ref 98–110)
GLOBULIN: 2.6 g/dL (ref 1.9–3.7)
Glucose, Bld: 78 mg/dL (ref 65–99)
POTASSIUM: 5.4 mmol/L — AB (ref 3.5–5.3)
SODIUM: 142 mmol/L (ref 135–146)
Total Protein: 6.7 g/dL (ref 6.1–8.1)

## 2017-07-10 LAB — MAGNESIUM: Magnesium: 1.9 mg/dL (ref 1.5–2.5)

## 2017-07-27 DIAGNOSIS — Y939 Activity, unspecified: Secondary | ICD-10-CM | POA: Diagnosis not present

## 2017-07-27 DIAGNOSIS — G8929 Other chronic pain: Secondary | ICD-10-CM | POA: Diagnosis not present

## 2017-07-27 DIAGNOSIS — M533 Sacrococcygeal disorders, not elsewhere classified: Secondary | ICD-10-CM | POA: Diagnosis not present

## 2017-07-27 DIAGNOSIS — M25551 Pain in right hip: Secondary | ICD-10-CM | POA: Diagnosis not present

## 2017-07-27 DIAGNOSIS — M7061 Trochanteric bursitis, right hip: Secondary | ICD-10-CM | POA: Diagnosis not present

## 2017-08-03 DIAGNOSIS — M25551 Pain in right hip: Secondary | ICD-10-CM | POA: Diagnosis not present

## 2017-08-03 DIAGNOSIS — M7061 Trochanteric bursitis, right hip: Secondary | ICD-10-CM | POA: Diagnosis not present

## 2017-08-05 DIAGNOSIS — M533 Sacrococcygeal disorders, not elsewhere classified: Secondary | ICD-10-CM | POA: Diagnosis not present

## 2017-08-05 DIAGNOSIS — M7061 Trochanteric bursitis, right hip: Secondary | ICD-10-CM | POA: Diagnosis not present

## 2017-08-11 ENCOUNTER — Encounter: Payer: Self-pay | Admitting: Family Medicine

## 2017-10-04 ENCOUNTER — Telehealth: Payer: Self-pay | Admitting: Family Medicine

## 2017-10-04 DIAGNOSIS — F325 Major depressive disorder, single episode, in full remission: Secondary | ICD-10-CM

## 2017-10-04 MED ORDER — FENOFIBRATE 160 MG PO TABS: 160.0000 mg | ORAL_TABLET | Freq: Every day | ORAL | 0 refills | Status: DC

## 2017-10-04 MED ORDER — ESCITALOPRAM OXALATE 10 MG PO TABS: 10.0000 mg | ORAL_TABLET | Freq: Every day | ORAL | 0 refills | Status: DC

## 2017-10-04 NOTE — Telephone Encounter (Addendum)
Copied from CRM [id number]. Topic: Quick Communication - See Telephone Encounter >> Oct 04, 2017  3:04 PM Trula SladeWalter, Linda F wrote: CRM for notification. See Telephone encounter for: 10/04/17. Patient would like a refill on her esomeprazole (NEXIUM) 40 MG capsule and her fenofibrate 160 MG tablet medication and have it sent to her preferred pharmacy [facility].  She is completely out of Fenofibrate medication.

## 2017-10-06 ENCOUNTER — Encounter: Payer: Self-pay | Admitting: Family Medicine

## 2017-10-06 MED ORDER — ESOMEPRAZOLE MAGNESIUM 40 MG PO CPDR: DELAYED_RELEASE_CAPSULE | ORAL | 3 refills | Status: DC

## 2017-10-06 MED ORDER — LEVOTHYROXINE SODIUM 75 MCG PO TABS: 75.0000 ug | ORAL_TABLET | Freq: Every day | ORAL | 3 refills | Status: DC

## 2017-10-06 NOTE — Telephone Encounter (Signed)
Both rx prescribed by another office. Please advise. Have not been refilled since 2017.

## 2017-10-08 ENCOUNTER — Encounter: Payer: Self-pay | Admitting: Family Medicine

## 2017-10-08 NOTE — Telephone Encounter (Signed)
Patient's last physical was in 10/18. Please advise on acute issue she is having. I will ask patient about prescription issues via mychart.

## 2017-10-18 ENCOUNTER — Telehealth: Payer: Self-pay | Admitting: Medical

## 2017-10-18 ENCOUNTER — Ambulatory Visit: Payer: BLUE CROSS/BLUE SHIELD | Admitting: Medical

## 2017-10-18 ENCOUNTER — Encounter: Payer: Self-pay | Admitting: Medical

## 2017-10-18 VITALS — BP 118/72 | HR 60 | Temp 97.9°F | Resp 16 | Ht 64.0 in | Wt 156.4 lb

## 2017-10-18 DIAGNOSIS — M791 Myalgia, unspecified site: Secondary | ICD-10-CM | POA: Diagnosis not present

## 2017-10-18 DIAGNOSIS — R350 Frequency of micturition: Secondary | ICD-10-CM | POA: Diagnosis not present

## 2017-10-18 DIAGNOSIS — R5383 Other fatigue: Secondary | ICD-10-CM | POA: Diagnosis not present

## 2017-10-18 LAB — POC URINALSYSI DIPSTICK (AUTOMATED)
BILIRUBIN UA: NEGATIVE
Blood, UA: NEGATIVE
GLUCOSE UA: NEGATIVE
KETONES UA: NEGATIVE
LEUKOCYTES UA: NEGATIVE
NITRITE UA: NEGATIVE
Protein, UA: POSITIVE — AB
SPEC GRAV UA: 1.015 (ref 1.010–1.025)
Urobilinogen, UA: NEGATIVE E.U./dL — AB
pH, UA: 6 (ref 5.0–8.0)

## 2017-10-18 LAB — POCT RAPID STREP A (OFFICE): Rapid Strep A Screen: NEGATIVE

## 2017-10-18 MED ORDER — FLUTICASONE PROPIONATE 50 MCG/ACT NA SUSP: 2.0000 | Freq: Every day | NASAL | 1 refills | Status: DC

## 2017-10-18 MED ORDER — LEVOCETIRIZINE DIHYDROCHLORIDE 5 MG PO TABS: 5.0000 mg | ORAL_TABLET | Freq: Every evening | ORAL | 0 refills | Status: DC

## 2017-10-18 MED ORDER — CIPROFLOXACIN HCL 250 MG PO TABS: 250.0000 mg | ORAL_TABLET | Freq: Two times a day (BID) | ORAL | 0 refills | Status: DC

## 2017-10-18 NOTE — Progress Notes (Signed)
Subjective:    Patient ID: Lindsay Neal, female    DOB: 1965/12/27, 52 y.o.   MRN: [id number]  HPI  Pt states since last Wednesday felt achy and moderate fatigue. Low level subjective fever. Mild sweaty this weekend. Runny nose and mild pnd. No sneezing. Minimal symptoms presently.  She also  has constant urge to urinate since past wed. No pain on urination. Pt states rare uti but did have one about one year ago. No cva pain, no nausea or vomiting.She notes when did get dx of uti did not have obvious symptoms in past.    Review of Systems  Constitutional: Positive for fatigue and fever. Negative for chills.  HENT: Positive for congestion, postnasal drip and rhinorrhea. Negative for ear discharge, ear pain, facial swelling and mouth sores.   Respiratory: Negative for cough, chest tightness, shortness of breath and wheezing.   Cardiovascular: Negative for chest pain and palpitations.  Gastrointestinal: Negative for abdominal pain.  Genitourinary: Positive for urgency. Negative for dysuria, flank pain, hematuria and vaginal pain.       Urge to urinate.  Musculoskeletal: Positive for myalgias. Negative for back pain, neck pain and neck stiffness.  Neurological: Negative for dizziness, syncope, speech difficulty, weakness and headaches.  Hematological: Negative for adenopathy. Does not bruise/bleed easily.  Psychiatric/Behavioral: Negative for behavioral problems, dysphoric mood, sleep disturbance and suicidal ideas.    Past Medical History:  Diagnosis Date  . Arthritis   . Frequent headaches   . GERD (gastroesophageal reflux disease)   . High cholesterol   . HPV (human papilloma virus) infection   . Sleep apnea   . Urine incontinence      Social History   Socioeconomic History  . Marital status: Married    Spouse name: Not on file  . Number of children: Not on file  . Years of education: Not on file  . Highest education level: Not on file  Occupational History  . Not on  file  Social Needs  . Financial resource strain: Not on file  . Food insecurity:    Worry: Not on file    Inability: Not on file  . Transportation needs:    Medical: Not on file    Non-medical: Not on file  Tobacco Use  . Smoking status: Never Smoker  . Smokeless tobacco: Never Used  Substance and Sexual Activity  . Alcohol use: No  . Drug use: Not on file  . Sexual activity: Not on file  Lifestyle  . Physical activity:    Days per week: Not on file    Minutes per session: Not on file  . Stress: Not on file  Relationships  . Social connections:    Talks on phone: Not on file    Gets together: Not on file    Attends religious service: Not on file    Active member of club or organization: Not on file    Attends meetings of clubs or organizations: Not on file    Relationship status: Not on file  . Intimate partner violence:    Fear of current or ex partner: Not on file    Emotionally abused: Not on file    Physically abused: Not on file    Forced sexual activity: Not on file  Other Topics Concern  . Not on file  Social History Narrative  . Not on file    Past Surgical History:  Procedure Laterality Date  . CESAREAN SECTION  04/13/1991  . GALLBLADDER SURGERY  10/08/2014  . GASTRIC BYPASS  10/08/2014  . PLANTAR FASCIA SURGERY  2015  . SHOULDER SURGERY Right 2014  . VAGINA SURGERY      Family History  Problem Relation Age of Onset  . Arthritis Mother   . Diabetes Mother   . Elevated Lipids Mother   . Elevated Lipids Father   . Heart disease Father   . Stroke Father   . Hypertension Father   . Heart disease Brother   . Colon cancer Maternal Grandmother   . Diabetes Maternal Grandmother   . Elevated Lipids Maternal Grandfather   . Arthritis Paternal Grandmother   . Hypertension Paternal Grandmother     Allergies  Allergen Reactions  . Penicillins   . Pollen Extract Other (See Comments)    Stuffy nose  . Sulfa Antibiotics     Current Outpatient  Medications on File Prior to Visit  Medication Sig Dispense Refill  . calcium citrate-vitamin D (CITRACAL+D) 315-200 MG-UNIT tablet Take 1 tablet by mouth daily.    . Cholecalciferol (D3-1000) 1000 units tablet     . Cyanocobalamin (B-12) 1000 MCG LOZG Take by mouth.    . escitalopram (LEXAPRO) 10 MG tablet Take 1 tablet (10 mg total) by mouth daily. 90 tablet 0  . esomeprazole (NEXIUM) 40 MG capsule TAKE 1 CAPSULE BY MOUTH 2 TIMES DAILY BEFORE MEALS. 180 capsule 3  . fenofibrate 160 MG tablet Take 1 tablet (160 mg total) by mouth daily. 30 tablet 0  . IRON PO Take 65 mg by mouth daily.    Marland Kitchen levothyroxine (SYNTHROID, LEVOTHROID) 75 MCG tablet Take 1 tablet (75 mcg total) by mouth daily. 90 tablet 3  . multivitamin-iron-minerals-folic acid (CENTRUM) chewable tablet Chew by mouth.     No current facility-administered medications on file prior to visit.     BP 93/60   Pulse 60   Temp 97.9 F (36.6 C) (Oral)   Resp 16   Ht 5\' 4"  (1.626 m)   Wt 156 lb 6.4 oz (70.9 kg)   LMP 12/16/2015   SpO2 100%   BMI 26.85 kg/m       Objective:   Physical Exam  General Mental Status- Alert. General Appearance- Not in acute distress.   Skin General: Color- Normal Color. Moisture- Normal Moisture.  Neck Carotid Arteries- Normal color. Moisture- Normal Moisture. No carotid bruits. No JVD.  Chest and Lung Exam Auscultation: Breath Sounds:-Normal.  Cardiovascular Auscultation:Rythm- Regular. Murmurs & Other Heart Sounds:Auscultation of the heart reveals- No Murmurs.  Abdomen Inspection:-Inspeection Normal. Palpation/Percussion:Note:No mass. Palpation and Percussion of the abdomen reveal- Non Tender, Non Distended + BS, no rebound or guarding.  Back- no cva tenderness  Neurologic Cranial Nerve exam:- CN III-XII intact(No nystagmus), symmetric smile. Strength:- 5/5 equal and symmetric strength both upper and lower extremities.    HEENT Head- Normal. Ear Auditory Canal - Left-  Normal. Right - Normal.Tympanic Membrane- Left- Normal. Right- Normal. Eye Sclera/Conjunctiva- Left- Normal. Right- Normal. Nose & Sinuses Nasal Mucosa- Left-  Boggy and Congested. Right-  Boggy and  Congested.Bilateral no maxillary and no  frontal sinus pressure. Mouth & Throat Lips: Upper Lip- Normal: no dryness, cracking, pallor, cyanosis, or vesicular eruption. Lower Lip-Normal: no dryness, cracking, pallor, cyanosis or vesicular eruption. Buccal Mucosa- Bilateral- No Aphthous ulcers. Oropharynx- No Discharge or Erythema. Tonsils: Characteristics- Bilateral- faint Erythema Size/Enlargement- Bilateral- No enlargement. Discharge- bilateral-None.  .         Assessment & Plan:  You have had some urgency to urinate over the  last 5 days with some description of possible frequent urination.  We reported history of some mild symptoms with prior rare UTI.  Will prescribe Cipro 250 mg twice daily for the next 3 days pending urine culture result.    For recent nasal congestion and runny nose, I am prescribing Flonase and Xyzal.  Your rapid strep test and flu test were both negative.  If you have any new or changing symptoms please let us know.  Appears presently that the mild achiness, fatigue and low-grade fever not related to flu or strep.  The symptoms sometimes can be associated with UTIs.  Follow-up in 7 to 10 days or as needed.  Esperanza Richters, PA-C

## 2017-10-18 NOTE — Patient Instructions (Addendum)
You have had some urgency to urinate over the last 5 days with some description of possible frequent urination.  We reported history of some mild symptoms with prior rare UTI.  Will prescribe Cipro 250 mg twice daily for the next 3 days pending urine culture result.    For recent nasal congestion and runny nose, I am prescribing Flonase and Xyzal.  Your rapid strep test and flu test were both negative.  If you have any new or changing symptoms please let us know.  Appears presently that the mild achiness, fatigue and low-grade fever not related to flu or strep.  The symptoms sometimes can be associated with UTIs.  Follow-up in 7 to 10 days or as needed.

## 2017-10-18 NOTE — Telephone Encounter (Signed)
Will you result pt flu test. Had to cancel order to close chart.

## 2017-10-19 LAB — URINE CULTURE
MICRO NUMBER: 91171276
RESULT: NO GROWTH
SPECIMEN QUALITY: ADEQUATE

## 2017-10-20 ENCOUNTER — Telehealth: Payer: Self-pay | Admitting: Medical

## 2017-10-20 ENCOUNTER — Encounter: Payer: Self-pay | Admitting: Medical

## 2017-10-20 MED ORDER — FLUCONAZOLE 150 MG PO TABS: 150.0000 mg | ORAL_TABLET | Freq: Once | ORAL | 0 refills | Status: AC

## 2017-10-20 NOTE — Telephone Encounter (Signed)
rx diflucan sent to pharmacy. 

## 2017-10-21 ENCOUNTER — Encounter: Payer: Self-pay | Admitting: Medical

## 2017-10-27 ENCOUNTER — Encounter: Payer: Self-pay | Admitting: Family Medicine

## 2017-10-30 NOTE — Progress Notes (Deleted)
[facility] [address] [place], [place] [address] [phone]  Date:  11/03/2017   Name:  Lindsay Neal   DOB:  [birth year]   MRN:  [id number]  PCP:  Lindsay Cables, MD    Chief Complaint: No chief complaint on file.   History of Present Illness:  Lindsay Neal is a 52 y.o. very pleasant female patient who presents with the following:  Here today for a physical and fasting labs  History of hypothyroidism, GERD, OSA, dyslipidemia, gastric bypass 2016 She works in medical coding  Never a smoker   Pap:1/18 Mammo: 3/18 Colon: cologuard 2018 Immun: flu Labs:  Full a year ago  Patient Active Problem List   Diagnosis Date Noted  . Right hip pain 03/05/2016  . Hypothyroidism due to acquired atrophy of thyroid 02/20/2016  . Status post bariatric surgery 02/20/2016  . 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 GERD (gastroesophageal reflux disease) 02/22/2013  . Dysmetabolic syndrome X 04/19/2012  . Allergic rhinitis 02/23/2012  . Depression 02/23/2012  . Sleep apnea, obstructive 02/23/2012    Past Medical History:  Diagnosis Date  . Arthritis   . Frequent headaches   . GERD (gastroesophageal reflux disease)   . High cholesterol   . HPV (human papilloma virus) infection   . Sleep apnea   . Urine incontinence     Past Surgical History:  Procedure Laterality Date  . CESAREAN SECTION  04/13/1991  . GALLBLADDER SURGERY  10/08/2014  . GASTRIC BYPASS  10/08/2014  . PLANTAR FASCIA SURGERY  2015  . SHOULDER SURGERY Right 2014  . VAGINA SURGERY      Social History   Tobacco Use  . Smoking status: Never Smoker  . Smokeless tobacco: Never Used  Substance Use Topics  . Alcohol use: No  . Drug use: Not on file    Family History  Problem Relation Age of Onset  . Arthritis Mother   . Diabetes Mother   . Elevated Lipids Mother   . Elevated Lipids Father   . Heart disease  Father   . Stroke Father   . Hypertension Father   . Heart disease Brother   . Colon cancer Maternal Grandmother   . Diabetes Maternal Grandmother   . Elevated Lipids Maternal Grandfather   . Arthritis Paternal Grandmother   . Hypertension Paternal Grandmother     Allergies  Allergen Reactions  . Penicillins   . Pollen Extract Other (See Comments)    Stuffy nose  . Sulfa Antibiotics     Medication list has been reviewed and updated.  Current Outpatient Medications on File Prior to Visit  Medication Sig Dispense Refill  . calcium citrate-vitamin D (CITRACAL+D) 315-200 MG-UNIT tablet Take 1 tablet by mouth daily.    . Cholecalciferol (D3-1000) 1000 units tablet     . ciprofloxacin (CIPRO) 250 MG tablet Take 1 tablet (250 mg total) by mouth 2 (two) times daily. 6 tablet 0  . Cyanocobalamin (B-12) 1000 MCG LOZG Take by mouth.    . escitalopram (LEXAPRO) 10 MG tablet Take 1 tablet (10 mg total) by mouth daily. 90 tablet 0  . esomeprazole (NEXIUM) 40 MG capsule TAKE 1 CAPSULE BY MOUTH 2 TIMES DAILY BEFORE MEALS. 180 capsule 3  . fenofibrate 160 MG tablet Take 1 tablet (160 mg total) by mouth daily. 30 tablet 0  . fluticasone (FLONASE) 50 MCG/ACT nasal spray Place 2 sprays into both nostrils  daily. 16 g 1  . IRON PO Take 65 mg by mouth daily.    Marland Kitchen levocetirizine (XYZAL) 5 MG tablet Take 1 tablet (5 mg total) by mouth every evening. 30 tablet 0  . levothyroxine (SYNTHROID, LEVOTHROID) 75 MCG tablet Take 1 tablet (75 mcg total) by mouth daily. 90 tablet 3  . multivitamin-iron-minerals-folic acid (CENTRUM) chewable tablet Chew by mouth.     No current facility-administered medications on file prior to visit.     Review of Systems:  As per HPI- otherwise negative.   Physical Examination: There were no vitals filed for this visit. There were no vitals filed for this visit. There is no height or weight on file to calculate BMI. Ideal Body Weight:    GEN: WDWN, NAD, Non-toxic, A &  O x 3 HEENT: Atraumatic, Normocephalic. Neck supple. No masses, No LAD. Ears and Nose: No external deformity. CV: RRR, No M/G/R. No JVD. No thrill. No extra heart sounds. PULM: CTA B, no wheezes, crackles, rhonchi. No retractions. No resp. distress. No accessory muscle use. ABD: S, NT, ND, +BS. No rebound. No HSM. EXTR: No c/c/e NEURO Normal gait.  PSYCH: Normally interactive. Conversant. Not depressed or anxious appearing.  Calm demeanor.    Assessment and Plan: ***  Signed Abbe Amsterdam, MD

## 2017-11-03 ENCOUNTER — Encounter: Payer: BLUE CROSS/BLUE SHIELD | Admitting: Family Medicine

## 2017-11-03 ENCOUNTER — Other Ambulatory Visit: Payer: Self-pay | Admitting: Family Medicine

## 2017-11-03 DIAGNOSIS — F325 Major depressive disorder, single episode, in full remission: Secondary | ICD-10-CM

## 2017-11-04 DIAGNOSIS — Z882 Allergy status to sulfonamides status: Secondary | ICD-10-CM | POA: Diagnosis not present

## 2017-11-04 DIAGNOSIS — Z880 Allergy status to penicillin: Secondary | ICD-10-CM | POA: Diagnosis not present

## 2017-11-04 DIAGNOSIS — M25551 Pain in right hip: Secondary | ICD-10-CM | POA: Diagnosis not present

## 2017-11-04 DIAGNOSIS — M7061 Trochanteric bursitis, right hip: Secondary | ICD-10-CM | POA: Diagnosis not present

## 2017-11-04 DIAGNOSIS — M533 Sacrococcygeal disorders, not elsewhere classified: Secondary | ICD-10-CM | POA: Diagnosis not present

## 2017-11-16 NOTE — Progress Notes (Addendum)
Healthcare at [facility] [address], [address] [place], [place] [address] [phone]  Date:  11/17/2017   Name:  Lindsay Neal   DOB:  [birth year]   MRN:  [id number]  PCP:  Pearline Cables, MD    Chief Complaint: Annual Exam   History of Present Illness:  Lindsay Neal is a 52 y.o. very pleasant female patient who presents with the following:  Here today for a CPE.  I last saw her in April for trochanteric bursitis. Seen a month ago with possible UTI - this is now cleared up History of hypothyroidism, gastric bypass in 2016, GERD, depression  Labs: due for complete  Immun: flu- done last week  Colon:10/18- cologuard neg Pap: 1/18 Mammo: 3/18- will be done per her GYN   She is on lexapro 10mg  and notes that her mood is good  Her allergies are under good control Fenofibrate for her cholesterol  Synthroid 75, repeat TSH today Will do appropriate labs for history of gastric bypass today as well   Her daughter gave birth to her 2nd grand-daughter last night!  Everyone is healthy and well   BP Readings from Last 3 Encounters:  11/17/17 112/70  10/18/17 118/72  07/09/17 104/60    Lab Results  Component Value Date   TSH 1.56 10/21/2016     Patient Active Problem List   Diagnosis Date Noted  . Right hip pain 03/05/2016  . Hypothyroidism due to acquired atrophy of thyroid 02/20/2016  . Status post bariatric surgery 02/20/2016  . 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 GERD (gastroesophageal reflux disease) 02/22/2013  . Dysmetabolic syndrome X 04/19/2012  . Allergic rhinitis 02/23/2012  . Depression 02/23/2012  . Sleep apnea, obstructive 02/23/2012    Past Medical History:  Diagnosis Date  . Arthritis   . Frequent headaches   . GERD (gastroesophageal reflux disease)   . High cholesterol   . HPV (human papilloma virus) infection   . Sleep apnea   . Urine incontinence     Past Surgical  History:  Procedure Laterality Date  . CESAREAN SECTION  04/13/1991  . GALLBLADDER SURGERY  10/08/2014  . GASTRIC BYPASS  10/08/2014  . PLANTAR FASCIA SURGERY  2015  . SHOULDER SURGERY Right 2014  . VAGINA SURGERY      Social History   Tobacco Use  . Smoking status: Never Smoker  . Smokeless tobacco: Never Used  Substance Use Topics  . Alcohol use: No  . Drug use: Not on file    Family History  Problem Relation Age of Onset  . Arthritis Mother   . Diabetes Mother   . Elevated Lipids Mother   . Elevated Lipids Father   . Heart disease Father   . Stroke Father   . Hypertension Father   . Heart disease Brother   . Colon cancer Maternal Grandmother   . Diabetes Maternal Grandmother   . Elevated Lipids Maternal Grandfather   . Arthritis Paternal Grandmother   . Hypertension Paternal Grandmother     Allergies  Allergen Reactions  . Penicillins   . Pollen Extract Other (See Comments)    Stuffy nose  . Sulfa Antibiotics     Medication list has been reviewed and updated.  Current Outpatient Medications on File Prior to Visit  Medication Sig Dispense Refill  . calcium citrate-vitamin D (CITRACAL+D) 315-200 MG-UNIT tablet Take 1 tablet by mouth daily.    . Cholecalciferol (D3-1000)  1000 units tablet     . Cyanocobalamin (B-12) 1000 MCG LOZG Take by mouth.    . escitalopram (LEXAPRO) 10 MG tablet TAKE 1 TABLET DAILY BY MOUTH. 90 tablet 3  . esomeprazole (NEXIUM) 40 MG capsule TAKE 1 CAPSULE BY MOUTH 2 TIMES DAILY BEFORE MEALS. 180 capsule 3  . fenofibrate 160 MG tablet TAKE ONE TABLET BY MOUTH EVERY DAY 30 tablet 5  . fluticasone (FLONASE) 50 MCG/ACT nasal spray Place 2 sprays into both nostrils daily. 16 g 1  . IRON PO Take 65 mg by mouth daily.    Marland Kitchen levocetirizine (XYZAL) 5 MG tablet Take 1 tablet (5 mg total) by mouth every evening. 30 tablet 0  . levothyroxine (SYNTHROID, LEVOTHROID) 75 MCG tablet Take 1 tablet (75 mcg total) by mouth daily. 90 tablet 3  .  multivitamin-iron-minerals-folic acid (CENTRUM) chewable tablet Chew by mouth.     No current facility-administered medications on file prior to visit.     Review of Systems:  As per HPI- otherwise negative. No fever or chills No CP or SOB   Physical Examination: Vitals:   11/17/17 1517  BP: 112/70  Pulse: 66  Resp: 16  Temp: 97.8 F (36.6 C)  SpO2: 99%   Vitals:   11/17/17 1517  Weight: 154 lb (69.9 kg)  Height: 5\' 4"  (1.626 m)   Body mass index is 26.43 kg/m. Ideal Body Weight: Weight in (lb) to have BMI = 25: 145.3  GEN: WDWN, NAD, Non-toxic, A & O x 3, minimal overweight, looks well  HEENT: Atraumatic, Normocephalic. Neck supple. No masses, No LAD.  Bilateral TM wnl, oropharynx normal.  PEERL,EOMI.   Ears and Nose: No external deformity. CV: RRR, No M/G/R. No JVD. No thrill. No extra heart sounds. PULM: CTA B, no wheezes, crackles, rhonchi. No retractions. No resp. distress. No accessory muscle use. ABD: S, NT, ND, +BS. No rebound. No HSM. EXTR: No c/c/e NEURO Normal gait.  PSYCH: Normally interactive. Conversant. Not depressed or anxious appearing.  Calm demeanor.    Assessment and Plan: Physical exam  Depression, major, single episode, complete remission (HCC)  Hypothyroidism due to acquired atrophy of thyroid - Plan: TSH  Status post bariatric surgery - Plan: CBC, Comprehensive metabolic panel, B12, Folate, Vitamin D (25 hydroxy)  Gastroesophageal reflux disease, esophagitis presence not specified  Screening for deficiency anemia - Plan: CBC  Screening for diabetes mellitus - Plan: Hemoglobin A1c  Screening for hyperlipidemia - Plan: Lipid panel  Takes iron supplements - Plan: Ferritin  Physical exam today Her depression is well controlled Await her other labs today and will be in touch asap  Signed Abbe Amsterdam, MD  Received her labs 10/31 Results for orders placed or performed in visit on 11/17/17  CBC  Result Value Ref Range   WBC  10.0 4.0 - 10.5 K/uL   RBC 4.25 3.87 - 5.11 Mil/uL   Platelets 367.0 150.0 - 400.0 K/uL   Hemoglobin 13.1 12.0 - 15.0 g/dL   HCT 11.9 14.7 - 82.9 %   MCV 92.0 78.0 - 100.0 fl   MCHC 33.5 30.0 - 36.0 g/dL   RDW 56.2 13.0 - 86.5 %  Comprehensive metabolic panel  Result Value Ref Range   Sodium 139 135 - 145 mEq/L   Potassium 4.4 3.5 - 5.1 mEq/L   Chloride 102 96 - 112 mEq/L   CO2 30 19 - 32 mEq/L   Glucose, Bld 82 70 - 99 mg/dL   BUN 17 6 - 23  mg/dL   Creatinine, Ser 1.61 0.40 - 1.20 mg/dL   Total Bilirubin 0.4 0.2 - 1.2 mg/dL   Alkaline Phosphatase 63 39 - 117 U/L   AST 24 0 - 37 U/L   ALT 19 0 - 35 U/L   Total Protein 6.8 6.0 - 8.3 g/dL   Albumin 4.4 3.5 - 5.2 g/dL   Calcium 9.8 8.4 - 09.6 mg/dL   GFR 04.54 >09.81 mL/min  Hemoglobin A1c  Result Value Ref Range   Hgb A1c MFr Bld 5.3 4.6 - 6.5 %  Lipid panel  Result Value Ref Range   Cholesterol 149 0 - 200 mg/dL   Triglycerides 19.1 0.0 - 149.0 mg/dL   HDL 47.82 >95.62 mg/dL   VLDL 13.0 0.0 - 86.5 mg/dL   LDL Cholesterol 78 0 - 99 mg/dL   Total CHOL/HDL Ratio 3    NonHDL 91.46   TSH  Result Value Ref Range   TSH 1.16 0.35 - 4.50 uIU/mL  B12  Result Value Ref Range   Vitamin B-12 >1525 (H) 211 - 911 pg/mL  Folate  Result Value Ref Range   Folate >23.9 >5.9 ng/mL  Vitamin D (25 hydroxy)  Result Value Ref Range   VITD 38.24 30.00 - 100.00 ng/mL  Ferritin  Result Value Ref Range   Ferritin 148.8 10.0 - 291.0 ng/mL   Message to pt

## 2017-11-17 ENCOUNTER — Encounter: Payer: Self-pay | Admitting: Family Medicine

## 2017-11-17 ENCOUNTER — Ambulatory Visit (INDEPENDENT_AMBULATORY_CARE_PROVIDER_SITE_OTHER): Payer: BLUE CROSS/BLUE SHIELD | Admitting: Family Medicine

## 2017-11-17 VITALS — BP 112/70 | HR 66 | Temp 97.8°F | Resp 16 | Ht 64.0 in | Wt 154.0 lb

## 2017-11-17 DIAGNOSIS — F325 Major depressive disorder, single episode, in full remission: Secondary | ICD-10-CM

## 2017-11-17 DIAGNOSIS — Z9884 Bariatric surgery status: Secondary | ICD-10-CM | POA: Diagnosis not present

## 2017-11-17 DIAGNOSIS — Z130 Encounter for screening for diseases of the blood and blood-forming organs and certain disorders involving the immune mechanism: Secondary | ICD-10-CM | POA: Diagnosis not present

## 2017-11-17 DIAGNOSIS — K219 Gastro-esophageal reflux disease without esophagitis: Secondary | ICD-10-CM

## 2017-11-17 DIAGNOSIS — E034 Atrophy of thyroid (acquired): Secondary | ICD-10-CM | POA: Diagnosis not present

## 2017-11-17 DIAGNOSIS — Z789 Other specified health status: Secondary | ICD-10-CM

## 2017-11-17 DIAGNOSIS — Z131 Encounter for screening for diabetes mellitus: Secondary | ICD-10-CM

## 2017-11-17 DIAGNOSIS — Z13220 Encounter for screening for lipoid disorders: Secondary | ICD-10-CM | POA: Diagnosis not present

## 2017-11-17 DIAGNOSIS — Z0000 Encounter for general adult medical examination without abnormal findings: Secondary | ICD-10-CM

## 2017-11-17 NOTE — Patient Instructions (Signed)
It was good to see you today!  Congrats on your new grand-daughter, she is beautiful!   I will be in touch with your labs asap  Health Maintenance, Female Adopting a healthy lifestyle and getting preventive care can go a long way to promote health and wellness. Talk with your health care provider about what schedule of regular examinations is right for you. This is a good chance for you to check in with your provider about disease prevention and staying healthy. In between checkups, there are plenty of things you can do on your own. Experts have done a lot of research about which lifestyle changes and preventive measures are most likely to keep you healthy. Ask your health care provider for more information. Weight and diet Eat a healthy diet  Be sure to include plenty of vegetables, fruits, low-fat dairy products, and lean protein.  Do not eat a lot of foods high in solid fats, added sugars, or salt.  Get regular exercise. This is one of the most important things you can do for your health. ? Most adults should exercise for at least 150 minutes each week. The exercise should increase your heart rate and make you sweat (moderate-intensity exercise). ? Most adults should also do strengthening exercises at least twice a week. This is in addition to the moderate-intensity exercise.  Maintain a healthy weight  Body mass index (BMI) is a measurement that can be used to identify possible weight problems. It estimates body fat based on height and weight. Your health care provider can help determine your BMI and help you achieve or maintain a healthy weight.  For females 27 years of age and older: ? A BMI below 18.5 is considered underweight. ? A BMI of 18.5 to 24.9 is normal. ? A BMI of 25 to 29.9 is considered overweight. ? A BMI of 30 and above is considered obese.  Watch levels of cholesterol and blood lipids  You should start having your blood tested for lipids and cholesterol at 51 years  of age, then have this test every 5 years.  You may need to have your cholesterol levels checked more often if: ? Your lipid or cholesterol levels are high. ? You are older than 51 years of age. ? You are at high risk for heart disease.  Cancer screening Lung Cancer  Lung cancer screening is recommended for adults 79-16 years old who are at high risk for lung cancer because of a history of smoking.  A yearly low-dose CT scan of the lungs is recommended for people who: ? Currently smoke. ? Have quit within the past 15 years. ? Have at least a 30-pack-year history of smoking. A pack year is smoking an average of one pack of cigarettes a day for 1 year.  Yearly screening should continue until it has been 15 years since you quit.  Yearly screening should stop if you develop a health problem that would prevent you from having lung cancer treatment.  Breast Cancer  Practice breast self-awareness. This means understanding how your breasts normally appear and feel.  It also means doing regular breast self-exams. Let your health care provider know about any changes, no matter how small.  If you are in your 20s or 30s, you should have a clinical breast exam (CBE) by a health care provider every 1-3 years as part of a regular health exam.  If you are 69 or older, have a CBE every year. Also consider having a breast X-ray (mammogram)  every year.  If you have a family history of breast cancer, talk to your health care provider about genetic screening.  If you are at high risk for breast cancer, talk to your health care provider about having an MRI and a mammogram every year.  Breast cancer gene (BRCA) assessment is recommended for women who have family members with BRCA-related cancers. BRCA-related cancers include: ? Breast. ? Ovarian. ? Tubal. ? Peritoneal cancers.  Results of the assessment will determine the need for genetic counseling and BRCA1 and BRCA2 testing.  Cervical  Cancer Your health care provider may recommend that you be screened regularly for cancer of the pelvic organs (ovaries, uterus, and vagina). This screening involves a pelvic examination, including checking for microscopic changes to the surface of your cervix (Pap test). You may be encouraged to have this screening done every 3 years, beginning at age 45.  For women ages 53-65, health care providers may recommend pelvic exams and Pap testing every 3 years, or they may recommend the Pap and pelvic exam, combined with testing for human papilloma virus (HPV), every 5 years. Some types of HPV increase your risk of cervical cancer. Testing for HPV may also be done on women of any age with unclear Pap test results.  Other health care providers may not recommend any screening for nonpregnant women who are considered low risk for pelvic cancer and who do not have symptoms. Ask your health care provider if a screening pelvic exam is right for you.  If you have had past treatment for cervical cancer or a condition that could lead to cancer, you need Pap tests and screening for cancer for at least 20 years after your treatment. If Pap tests have been discontinued, your risk factors (such as having a new sexual partner) need to be reassessed to determine if screening should resume. Some women have medical problems that increase the chance of getting cervical cancer. In these cases, your health care provider may recommend more frequent screening and Pap tests.  Colorectal Cancer  This type of cancer can be detected and often prevented.  Routine colorectal cancer screening usually begins at 51 years of age and continues through 51 years of age.  Your health care provider may recommend screening at an earlier age if you have risk factors for colon cancer.  Your health care provider may also recommend using home test kits to check for hidden blood in the stool.  A small camera at the end of a tube can be used to  examine your colon directly (sigmoidoscopy or colonoscopy). This is done to check for the earliest forms of colorectal cancer.  Routine screening usually begins at age 74.  Direct examination of the colon should be repeated every 5-10 years through 51 years of age. However, you may need to be screened more often if early forms of precancerous polyps or small growths are found.  Skin Cancer  Check your skin from head to toe regularly.  Tell your health care provider about any new moles or changes in moles, especially if there is a change in a mole's shape or color.  Also tell your health care provider if you have a mole that is larger than the size of a pencil eraser.  Always use sunscreen. Apply sunscreen liberally and repeatedly throughout the day.  Protect yourself by wearing long sleeves, pants, a wide-brimmed hat, and sunglasses whenever you are outside.  Heart disease, diabetes, and high blood pressure  High blood pressure causes  heart disease and increases the risk of stroke. High blood pressure is more likely to develop in: ? People who have blood pressure in the high end of the normal range (130-139/85-89 mm Hg). ? People who are overweight or obese. ? People who are African American.  If you are 51-35 years of age, have your blood pressure checked every 3-5 years. If you are 45 years of age or older, have your blood pressure checked every year. You should have your blood pressure measured twice-once when you are at a hospital or clinic, and once when you are not at a hospital or clinic. Record the average of the two measurements. To check your blood pressure when you are not at a hospital or clinic, you can use: ? An automated blood pressure machine at a pharmacy. ? A home blood pressure monitor.  If you are between 108 years and 69 years old, ask your health care provider if you should take aspirin to prevent strokes.  Have regular diabetes screenings. This involves taking a  blood sample to check your fasting blood sugar level. ? If you are at a normal weight and have a low risk for diabetes, have this test once every three years after 51 years of age. ? If you are overweight and have a high risk for diabetes, consider being tested at a younger age or more often. Preventing infection Hepatitis B  If you have a higher risk for hepatitis B, you should be screened for this virus. You are considered at high risk for hepatitis B if: ? You were born in a country where hepatitis B is common. Ask your health care provider which countries are considered high risk. ? Your parents were born in a high-risk country, and you have not been immunized against hepatitis B (hepatitis B vaccine). ? You have HIV or AIDS. ? You use needles to inject street drugs. ? You live with someone who has hepatitis B. ? You have had sex with someone who has hepatitis B. ? You get hemodialysis treatment. ? You take certain medicines for conditions, including cancer, organ transplantation, and autoimmune conditions.  Hepatitis C  Blood testing is recommended for: ? Everyone born from [birth year] through 1964. ? Anyone with known risk factors for hepatitis C.  Sexually transmitted infections (STIs)  You should be screened for sexually transmitted infections (STIs) including gonorrhea and chlamydia if: ? You are sexually active and are younger than 51 years of age. ? You are older than 51 years of age and your health care provider tells you that you are at risk for this type of infection. ? Your sexual activity has changed since you were last screened and you are at an increased risk for chlamydia or gonorrhea. Ask your health care provider if you are at risk.  If you do not have HIV, but are at risk, it may be recommended that you take a prescription medicine daily to prevent HIV infection. This is called pre-exposure prophylaxis (PrEP). You are considered at risk if: ? You are sexually active and  do not regularly use condoms or know the HIV status of your partner(s). ? You take drugs by injection. ? You are sexually active with a partner who has HIV.  Talk with your health care provider about whether you are at high risk of being infected with HIV. If you choose to begin PrEP, you should first be tested for HIV. You should then be tested every 3 months for as long as  you are taking PrEP. Pregnancy  If you are premenopausal and you may become pregnant, ask your health care provider about preconception counseling.  If you may become pregnant, take 400 to 800 micrograms (mcg) of folic acid every day.  If you want to prevent pregnancy, talk to your health care provider about birth control (contraception). Osteoporosis and menopause  Osteoporosis is a disease in which the bones lose minerals and strength with aging. This can result in serious bone fractures. Your risk for osteoporosis can be identified using a bone density scan.  If you are 77 years of age or older, or if you are at risk for osteoporosis and fractures, ask your health care provider if you should be screened.  Ask your health care provider whether you should take a calcium or vitamin D supplement to lower your risk for osteoporosis.  Menopause may have certain physical symptoms and risks.  Hormone replacement therapy may reduce some of these symptoms and risks. Talk to your health care provider about whether hormone replacement therapy is right for you. Follow these instructions at home:  Schedule regular health, dental, and eye exams.  Stay current with your immunizations.  Do not use any tobacco products including cigarettes, chewing tobacco, or electronic cigarettes.  If you are pregnant, do not drink alcohol.  If you are breastfeeding, limit how much and how often you drink alcohol.  Limit alcohol intake to no more than 1 drink per day for nonpregnant women. One drink equals 12 ounces of beer, 5 ounces of  wine, or 1 ounces of hard liquor.  Do not use street drugs.  Do not share needles.  Ask your health care provider for help if you need support or information about quitting drugs.  Tell your health care provider if you often feel depressed.  Tell your health care provider if you have ever been abused or do not feel safe at home. This information is not intended to replace advice given to you by your health care provider. Make sure you discuss any questions you have with your health care provider. Document Released: 07/21/2010 Document Revised: 06/13/2015 Document Reviewed: 10/09/2014 Elsevier Interactive Patient Education  [employer].

## 2017-11-18 ENCOUNTER — Encounter: Payer: Self-pay | Admitting: Family Medicine

## 2017-11-18 LAB — COMPREHENSIVE METABOLIC PANEL
ALT: 19 U/L (ref 0–35)
AST: 24 U/L (ref 0–37)
Albumin: 4.4 g/dL (ref 3.5–5.2)
Alkaline Phosphatase: 63 U/L (ref 39–117)
BUN: 17 mg/dL (ref 6–23)
CO2: 30 meq/L (ref 19–32)
Calcium: 9.8 mg/dL (ref 8.4–10.5)
Chloride: 102 mEq/L (ref 96–112)
Creatinine, Ser: 0.78 mg/dL (ref 0.40–1.20)
GFR: 82.57 mL/min (ref >60.00)
GLUCOSE: 82 mg/dL (ref 70–99)
POTASSIUM: 4.4 meq/L (ref 3.5–5.1)
SODIUM: 139 meq/L (ref 135–145)
TOTAL PROTEIN: 6.8 g/dL (ref 6.0–8.3)
Total Bilirubin: 0.4 mg/dL (ref 0.2–1.2)

## 2017-11-18 LAB — VITAMIN B12: Vitamin B-12: >1525 pg/mL — ABNORMAL HIGH (ref 211–911)

## 2017-11-18 LAB — LIPID PANEL
CHOL/HDL RATIO: 3
Cholesterol: 149 mg/dL (ref 0–200)
HDL: 57.2 mg/dL (ref >39.00)
LDL CALC: 78 mg/dL (ref 0–99)
NONHDL: 91.46
Triglycerides: 69 mg/dL (ref 0.0–149.0)
VLDL: 13.8 mg/dL (ref 0.0–40.0)

## 2017-11-18 LAB — TSH: TSH: 1.16 u[IU]/mL (ref 0.35–4.50)

## 2017-11-18 LAB — CBC
HCT: 39.1 % (ref 36.0–46.0)
Hemoglobin: 13.1 g/dL (ref 12.0–15.0)
MCHC: 33.5 g/dL (ref 30.0–36.0)
MCV: 92 fl (ref 78.0–100.0)
Platelets: 367 10*3/uL (ref 150.0–400.0)
RBC: 4.25 Mil/uL (ref 3.87–5.11)
RDW: 12.8 % (ref 11.5–15.5)
WBC: 10 10*3/uL (ref 4.0–10.5)

## 2017-11-18 LAB — FOLATE

## 2017-11-18 LAB — VITAMIN D 25 HYDROXY (VIT D DEFICIENCY, FRACTURES): VITD: 38.24 ng/mL (ref 30.00–100.00)

## 2017-11-18 LAB — HEMOGLOBIN A1C: Hgb A1c MFr Bld: 5.3 % (ref 4.6–6.5)

## 2017-11-18 LAB — FERRITIN: Ferritin: 148.8 ng/mL (ref 10.0–291.0)

## 2017-11-19 ENCOUNTER — Encounter: Payer: Self-pay | Admitting: Family Medicine

## 2017-11-22 ENCOUNTER — Encounter: Payer: Self-pay | Admitting: Family Medicine

## 2017-11-29 ENCOUNTER — Encounter: Payer: Self-pay | Admitting: Family Medicine

## 2017-11-29 ENCOUNTER — Ambulatory Visit: Payer: BLUE CROSS/BLUE SHIELD | Admitting: Family Medicine

## 2017-11-29 VITALS — BP 108/68 | HR 80 | Temp 97.6°F | Ht 64.0 in | Wt 156.0 lb

## 2017-11-29 DIAGNOSIS — R0781 Pleurodynia: Secondary | ICD-10-CM | POA: Diagnosis not present

## 2017-11-29 MED ORDER — MELOXICAM 15 MG PO TABS: 15.0000 mg | ORAL_TABLET | Freq: Every day | ORAL | 0 refills | Status: DC

## 2017-11-29 NOTE — Progress Notes (Signed)
Musculoskeletal Exam  Patient: Lindsay Neal DOB: Jul 20, 1965  DOS: 11/29/2017  SUBJECTIVE:  Chief Complaint:   Chief Complaint  Patient presents with  . Follow-up    rib pain for 2 weeks.      Lindsay Neal is a 52 y.o.  female for evaluation and treatment of rib pain.   Onset:  2 weeks ago.  Had a fall over a bath tub.  Location: R lower chest wall Character: throbbing/aching Progression of issue:  is unchanged Associated symptoms: Some pain with belching and deep breaths Treatment: to date has been acetaminophen, heat and tramadol.    ROS: Musculoskeletal/Extremities: +chest wall pain  Past Medical History:  Diagnosis Date  . Arthritis   . Frequent headaches   . GERD (gastroesophageal reflux disease)   . High cholesterol   . HPV (human papilloma virus) infection   . Sleep apnea   . Urine incontinence     Objective: VITAL SIGNS: BP 108/68 (BP Location: Left Arm, Patient Position: Sitting, Cuff Size: Normal)   Pulse 80   Temp 97.6 F (36.4 C) (Oral)   Ht 5\' 4"  (1.626 m)   Wt 156 lb (70.8 kg)   LMP 12/16/2015   SpO2 97%   BMI 26.78 kg/m  Constitutional: Well formed, well developed. No acute distress. Cardiovascular: RRR Thorax & Lungs: CTAB. No accessory muscle use Musculoskeletal: Chest wall.   Normal active range of motion: Yes of UE's Tenderness to palpation: yes, over R 8th rib, no crepitus, erythema Deformity: no Ecchymosis: no Neurologic: Normal sensory function. No focal deficits noted.  Psychiatric: Normal mood. Age appropriate judgment and insight. Alert & oriented x 3.    Assessment:  Pain in rib  Plan: Tylenol, Tramadol, ice, deep breaths. Warning signs and symptoms verbalized and written down in AVS.  Pt has Celebrex samples at home. If needed, we can call 7.5 mg dosage of Mobic given hx of gastric bypass. F/u prn. The patient voiced understanding and agreement to the plan.   Jilda Roche [place], DO 11/29/17  4:14 PM

## 2017-11-29 NOTE — Progress Notes (Signed)
Pre visit review using our clinic review tool, if applicable. No additional management support is needed unless otherwise documented below in the visit note. 

## 2017-11-29 NOTE — Patient Instructions (Addendum)
Ice/cold pack over area for 10-15 min twice daily.  OK to take Tylenol 1000 mg (2 extra strength tabs) or 975 mg (3 regular strength tabs) every 6 hours as needed.  Take deep breaths.  Things to look out for: shortness of breath, fevers, cough.  Let us know if you need anything.

## 2017-11-30 ENCOUNTER — Other Ambulatory Visit: Payer: Self-pay | Admitting: Family Medicine

## 2017-11-30 MED ORDER — PREDNISONE 20 MG PO TABS: 40.0000 mg | ORAL_TABLET | Freq: Every day | ORAL | 0 refills | Status: AC

## 2017-11-30 NOTE — Progress Notes (Signed)
Pt w hx of bypass. Discussed tx options with pharmacy team and in addition to Tylenol and judicious use of tramadol (makes her sleepy) will do a 5 d course of pred. Sent in.

## 2017-12-25 DIAGNOSIS — R0782 Intercostal pain: Secondary | ICD-10-CM | POA: Diagnosis not present

## 2018-01-10 ENCOUNTER — Ambulatory Visit: Payer: BLUE CROSS/BLUE SHIELD | Admitting: Medical

## 2018-01-10 ENCOUNTER — Ambulatory Visit: Payer: BLUE CROSS/BLUE SHIELD | Admitting: Family Medicine

## 2018-01-10 ENCOUNTER — Encounter: Payer: Self-pay | Admitting: Family Medicine

## 2018-01-10 VITALS — BP 118/70 | HR 105 | Temp 99.8°F | Resp 16 | Ht 64.0 in | Wt 159.4 lb

## 2018-01-10 DIAGNOSIS — J101 Influenza due to other identified influenza virus with other respiratory manifestations: Secondary | ICD-10-CM

## 2018-01-10 DIAGNOSIS — J029 Acute pharyngitis, unspecified: Secondary | ICD-10-CM

## 2018-01-10 DIAGNOSIS — R6883 Chills (without fever): Secondary | ICD-10-CM

## 2018-01-10 DIAGNOSIS — R52 Pain, unspecified: Secondary | ICD-10-CM

## 2018-01-10 LAB — POC INFLUENZA A&B (BINAX/QUICKVUE)
INFLUENZA B, POC: NEGATIVE
Influenza A, POC: POSITIVE — AB

## 2018-01-10 LAB — POCT RAPID STREP A (OFFICE): Rapid Strep A Screen: NEGATIVE

## 2018-01-10 MED ORDER — OSELTAMIVIR PHOSPHATE 75 MG PO CAPS: 75.0000 mg | ORAL_CAPSULE | Freq: Two times a day (BID) | ORAL | 0 refills | Status: DC

## 2018-01-10 NOTE — Progress Notes (Signed)
Patient ID: Lindsay AhrConnie Kapuscinski, female    DOB: 07/26/1965  Age: 52 y.o. MRN: [id number]    Subjective:  Subjective  HPI Lindsay Neal presents for bodyaches, fever and uri symptoms   Taking tylenol/ nsaid for fever .  Review of Systems  Constitutional: Positive for chills, fatigue and fever.  HENT: Positive for congestion, postnasal drip, rhinorrhea and sore throat. Negative for sinus pressure.   Respiratory: Negative for cough, chest tightness, shortness of breath and wheezing.   Cardiovascular: Negative for chest pain, palpitations and leg swelling.  Musculoskeletal: Positive for myalgias.  Allergic/Immunologic: Negative for environmental allergies.    History Past Medical History:  Diagnosis Date  . Arthritis   . Frequent headaches   . GERD (gastroesophageal reflux disease)   . High cholesterol   . HPV (human papilloma virus) infection   . Sleep apnea   . Urine incontinence     She has a past surgical history that includes Vagina surgery; Cesarean section (04/13/1991); Plantar fascia surgery (2015); Shoulder surgery (Right, 2014); Gastric bypass (10/08/2014); and Gallbladder surgery (10/08/2014).   Her family history includes Arthritis in her mother and paternal grandmother; Colon cancer in her maternal grandmother; Diabetes in her maternal grandmother and mother; Elevated Lipids in her father, maternal grandfather, and mother; Heart disease in her brother and father; Hypertension in her father and paternal grandmother; Stroke in her father.She reports that she has never smoked. She has never used smokeless tobacco. She reports that she does not drink alcohol. No history on file for drug.  Current Outpatient Medications on File Prior to Visit  Medication Sig Dispense Refill  . calcium citrate-vitamin D (CITRACAL+D) 315-200 MG-UNIT tablet Take 1 tablet by mouth daily.    . Cholecalciferol (D3-1000) 1000 units tablet     . Cyanocobalamin (B-12) 1000 MCG LOZG Take by mouth.    .  escitalopram (LEXAPRO) 10 MG tablet TAKE 1 TABLET DAILY BY MOUTH. 90 tablet 3  . esomeprazole (NEXIUM) 40 MG capsule TAKE 1 CAPSULE BY MOUTH 2 TIMES DAILY BEFORE MEALS. 180 capsule 3  . fenofibrate 160 MG tablet TAKE ONE TABLET BY MOUTH EVERY DAY 30 tablet 5  . fluticasone (FLONASE) 50 MCG/ACT nasal spray Place 2 sprays into both nostrils daily. 16 g 1  . IRON PO Take 65 mg by mouth daily.    Marland Kitchen. levocetirizine (XYZAL) 5 MG tablet Take 1 tablet (5 mg total) by mouth every evening. 30 tablet 0  . levothyroxine (SYNTHROID, LEVOTHROID) 75 MCG tablet Take 1 tablet (75 mcg total) by mouth daily. 90 tablet 3  . multivitamin-iron-minerals-folic acid (CENTRUM) chewable tablet Chew by mouth.     No current facility-administered medications on file prior to visit.      Objective:  Objective  Physical Exam Vitals signs and nursing note reviewed.  Constitutional:      Appearance: She is well-developed.  HENT:     Head: Normocephalic and atraumatic.  Eyes:     Conjunctiva/sclera: Conjunctivae normal.  Neck:     Musculoskeletal: Normal range of motion and neck supple.     Thyroid: No thyromegaly.     Vascular: No carotid bruit or JVD.  Cardiovascular:     Rate and Rhythm: Normal rate and regular rhythm.     Heart sounds: Normal heart sounds. No murmur.  Pulmonary:     Effort: Pulmonary effort is normal. No respiratory distress.     Breath sounds: Normal breath sounds. No wheezing or rales.  Chest:     Chest wall: No  tenderness.  Neurological:     Mental Status: She is alert and oriented to person, place, and time.    BP 118/70 (BP Location: Right Arm, Cuff Size: Normal)   Pulse (!) 105   Temp 99.8 F (37.7 C) (Oral)   Resp 16   Ht 5\' 4"  (1.626 m)   Wt 159 lb 6.4 oz (72.3 kg)   LMP 12/16/2015   SpO2 99%   BMI 27.36 kg/m  Wt Readings from Last 3 Encounters:  01/10/18 159 lb 6.4 oz (72.3 kg)  11/29/17 156 lb (70.8 kg)  11/17/17 154 lb (69.9 kg)     Lab Results  Component Value  Date   WBC 10.0 11/17/2017   HGB 13.1 11/17/2017   HCT 39.1 11/17/2017   PLT 367.0 11/17/2017   GLUCOSE 82 11/17/2017   CHOL 149 11/17/2017   TRIG 69.0 11/17/2017   HDL 57.20 11/17/2017   LDLCALC 78 11/17/2017   ALT 19 11/17/2017   AST 24 11/17/2017   NA 139 11/17/2017   K 4.4 11/17/2017   CL 102 11/17/2017   CREATININE 0.78 11/17/2017   BUN 17 11/17/2017   CO2 30 11/17/2017   TSH 1.16 11/17/2017   HGBA1C 5.3 11/17/2017    [place] Venous Img Lower Unilateral Right  Result Date: 07/09/2017 CLINICAL DATA:  Acute right lower leg swelling EXAM: Right LOWER EXTREMITY VENOUS DOPPLER ULTRASOUND TECHNIQUE: Gray-scale sonography with graded compression, as well as color Doppler and duplex ultrasound were performed to evaluate the lower extremity deep venous systems from the level of the common femoral vein and including the common femoral, femoral, profunda femoral, popliteal and calf veins including the posterior tibial, peroneal and gastrocnemius veins when visible. The superficial great saphenous vein was also interrogated. Spectral Doppler was utilized to evaluate flow at rest and with distal augmentation maneuvers in the common femoral, femoral and popliteal veins. COMPARISON:  None. FINDINGS: Contralateral Common Femoral Vein: Respiratory phasicity is normal and symmetric with the symptomatic side. No evidence of thrombus. Normal compressibility. Common Femoral Vein: No evidence of thrombus. Normal compressibility, respiratory phasicity and response to augmentation. Saphenofemoral Junction: No evidence of thrombus. Normal compressibility and flow on color Doppler imaging. Profunda Femoral Vein: No evidence of thrombus. Normal compressibility and flow on color Doppler imaging. Femoral Vein: No evidence of thrombus. Normal compressibility, respiratory phasicity and response to augmentation. Popliteal Vein: No evidence of thrombus. Normal compressibility, respiratory phasicity and response to  augmentation. Calf Veins: No evidence of thrombus. Normal compressibility and flow on color Doppler imaging. Superficial Great Saphenous Vein: No evidence of thrombus. Normal compressibility. Venous Reflux:  None. Other Findings:  None. IMPRESSION: No evidence of deep venous thrombosis seen in the right lower extremity. Electronically Signed   By: Lupita RaiderJames  Green Jr, M.D.   On: 07/09/2017 18:20     Assessment & Plan:  Plan  I am having Lindsay Neal start on oseltamivir. I am also having her maintain her multivitamin-iron-minerals-folic acid, B-12, IRON PO, calcium citrate-vitamin D, Cholecalciferol, esomeprazole, levothyroxine, fluticasone, levocetirizine, escitalopram, and fenofibrate.  Meds ordered this encounter  Medications  . oseltamivir (TAMIFLU) 75 MG capsule    Sig: Take 1 capsule (75 mg total) by mouth 2 (two) times daily.    Dispense:  14 capsule    Refill:  0    Problem List Items Addressed This Visit    None    Visit Diagnoses    Influenza A    -  Primary   Relevant Medications   oseltamivir (TAMIFLU)  75 MG capsule   Chills       Generalized body aches       Relevant Orders   POC Influenza A&B (Binax test) (Completed)   Sore throat       Relevant Orders   POCT rapid strep A (Completed)    flu +,  Strep neg con't with otc meds Drink plenty of fluids tamiflu  Note for work written  Follow-up: Return if symptoms worsen or fail to improve.  Donato Schultz, DO

## 2018-01-10 NOTE — Patient Instructions (Signed)

## 2018-01-14 ENCOUNTER — Ambulatory Visit: Payer: Self-pay | Admitting: *Deleted

## 2018-01-14 MED ORDER — BENZONATATE 100 MG PO CAPS: 100.0000 mg | ORAL_CAPSULE | Freq: Three times a day (TID) | ORAL | 0 refills | Status: DC | PRN

## 2018-01-14 NOTE — Telephone Encounter (Signed)
Reviewed note. Tessalon Perles called in. TY.

## 2018-01-14 NOTE — Telephone Encounter (Signed)
Pt was seen for an OV with Dr. Zola ButtonLowne Chase and was diagnosed with the flu. Pt states she has been taking Dayquil, Nyquil ,Mucinex and cough drops but nothing is helping with the cough. Pt states she is still experiencing congestion and is coughing up clear sputum. Pt feels that her cough is worse now than when she was seen in office on 12/23. Pt asking if medication to help with coughing could be called into [facility] on [address]  Answer Assessment - Initial Assessment Questions 1. DIAGNOSIS CONFIRMATION: "When was the influenza diagnosed?" "By whom?" "Did you get a test for it?"     During OV on 01/10/18 2. MEDICINES: "Were you prescribed any medications for the influenza?"  (e.g., zanamivir [Relenza], oseltamavir [Tamiflu]).      Pt was given a prescription for Tamiflu  3. SYMPTOMS: "What symptoms are you most concerned about?" (e.g., runny nose, stuffy nose, sore throat, cough, breathing difficulty, fever)     cough 4. COUGH: "How bad is the cough?"     Pt states she has coughing spells and cough is worse than when she was originally seen in office for visit 5. HIGH RISK for COMPLICATIONS: "Do you have any heart or lung problems? Do you have a weakened immune system?" (e.g., CHF, COPD, asthma, HIV positive, chemotherapy, renal failure, diabetes mellitus, sickle cell anemia)       No  Protocols used: INFLUENZA FOLLOW-UP CALL-A-AH

## 2018-01-14 NOTE — Telephone Encounter (Signed)
Patient notified that medication has been sent in. 

## 2018-01-15 ENCOUNTER — Emergency Department (HOSPITAL_BASED_OUTPATIENT_CLINIC_OR_DEPARTMENT_OTHER): Payer: BLUE CROSS/BLUE SHIELD

## 2018-01-15 ENCOUNTER — Other Ambulatory Visit: Payer: Self-pay

## 2018-01-15 ENCOUNTER — Emergency Department (HOSPITAL_BASED_OUTPATIENT_CLINIC_OR_DEPARTMENT_OTHER)
Admission: EM | Admit: 2018-01-15 | Discharge: 2018-01-15 | Disposition: A | Discharge status: Home / Self Care | Payer: BLUE CROSS/BLUE SHIELD | Attending: Emergency Medicine | Admitting: Emergency Medicine

## 2018-01-15 ENCOUNTER — Encounter (HOSPITAL_BASED_OUTPATIENT_CLINIC_OR_DEPARTMENT_OTHER): Payer: Self-pay | Admitting: Emergency Medicine

## 2018-01-15 DIAGNOSIS — Z9884 Bariatric surgery status: Secondary | ICD-10-CM | POA: Insufficient documentation

## 2018-01-15 DIAGNOSIS — R5381 Other malaise: Secondary | ICD-10-CM | POA: Diagnosis not present

## 2018-01-15 DIAGNOSIS — R059 Cough, unspecified: Secondary | ICD-10-CM

## 2018-01-15 DIAGNOSIS — R05 Cough: Secondary | ICD-10-CM | POA: Diagnosis not present

## 2018-01-15 MED ORDER — ALBUTEROL SULFATE HFA 108 (90 BASE) MCG/ACT IN AERS
2.0000 | INHALATION_SPRAY | Freq: Once | RESPIRATORY_TRACT | Status: AC
  Administered 2018-01-15: 2 via RESPIRATORY_TRACT
  Filled 2018-01-15: qty 6.7

## 2018-01-15 NOTE — Discharge Instructions (Signed)
You were evaluated in the Emergency Department and after careful evaluation, we did not find any emergent condition requiring admission or further testing in the hospital.  Your symptoms today seem to be due to continued symptoms related to the flu.  Your chest x-ray today did not show any evidence of pneumonia.  Please use the inhaler provided as needed every 4-6 hours for coughing spells.  Please return to the Emergency Department if you experience any worsening of your condition.  We encourage you to follow up with a primary care provider.  Thank you for allowing us to be a part of your care.

## 2018-01-15 NOTE — ED Triage Notes (Signed)
Diagnosed with flu earlier this week. States not feeling any better.

## 2018-01-15 NOTE — ED Provider Notes (Signed)
[facility] Emergency Department Provider Note MRN:  [id number]  Arrival date & time: 01/15/18     Chief Complaint   Cough   History of Present Illness   [name] is a 52 y.o. year-old female with no pertinent past medical history presenting to the ED with chief complaint of cough.  Diagnosed with influenza last week.  Continues to feel unwell, persistent cough, general malaise.  Denies headache or vision change, no neck pain, no chest pain or shortness of breath.  No abdominal pain, no dysuria, no numbness or weakness to the arms or legs.  Symptoms are constant, do not seem to be improving at home on Tamiflu.  Last dose today.  Was told to come in if her symptoms persisted for 1 week.  Review of Systems  A complete 10 system review of systems was obtained and all systems are negative except as noted in the HPI and PMH.   Patient's Health History    Past Medical History:  Diagnosis Date  . Arthritis   . Frequent headaches   . GERD (gastroesophageal reflux disease)   . High cholesterol   . HPV (human papilloma virus) infection   . Sleep apnea   . Urine incontinence     Past Surgical History:  Procedure Laterality Date  . CESAREAN SECTION  04/13/1991  . GALLBLADDER SURGERY  10/08/2014  . GASTRIC BYPASS  10/08/2014  . PLANTAR FASCIA SURGERY  2015  . SHOULDER SURGERY Right 2014  . VAGINA SURGERY      Family History  Problem Relation Age of Onset  . Arthritis Mother   . Diabetes Mother   . Elevated Lipids Mother   . Elevated Lipids Father   . Heart disease Father   . Stroke Father   . Hypertension Father   . Heart disease Brother   . Colon cancer Maternal Grandmother   . Diabetes Maternal Grandmother   . Elevated Lipids Maternal Grandfather   . Arthritis Paternal Grandmother   . Hypertension Paternal Grandmother     Social History   Socioeconomic History  . Marital status: Married    Spouse name: Not on file  . Number of  children: Not on file  . Years of education: Not on file  . Highest education level: Not on file  Occupational History  . Not on file  Social Needs  . Financial resource strain: Not on file  . Food insecurity:    Worry: Not on file    Inability: Not on file  . Transportation needs:    Medical: Not on file    Non-medical: Not on file  Tobacco Use  . Smoking status: Never Smoker  . Smokeless tobacco: Never Used  Substance and Sexual Activity  . Alcohol use: No  . Drug use: Not on file  . Sexual activity: Not on file  Lifestyle  . Physical activity:    Days per week: Not on file    Minutes per session: Not on file  . Stress: Not on file  Relationships  . Social connections:    Talks on phone: Not on file    Gets together: Not on file    Attends religious service: Not on file    Active member of club or organization: Not on file    Attends meetings of clubs or organizations: Not on file    Relationship status: Not on file  . Intimate partner violence:    Fear of current or ex partner: Not on  file    Emotionally abused: Not on file    Physically abused: Not on file    Forced sexual activity: Not on file  Other Topics Concern  . Not on file  Social History Narrative  . Not on file     Physical Exam  Vital Signs and Nursing Notes reviewed Vitals:   01/15/18 1102  BP: 103/63  Pulse: 86  Resp: 18  Temp: 98.4 F (36.9 C)  SpO2: 98%    CONSTITUTIONAL: Well-appearing, NAD NEURO:  Alert and oriented x 3, no focal deficits EYES:  eyes equal and reactive ENT/NECK:  no LAD, no JVD CARDIO: Regular rate, well-perfused, normal S1 and S2 PULM:  CTAB no wheezing or rhonchi GI/GU:  normal bowel sounds, non-distended, non-tender MSK/SPINE:  No gross deformities, no edema SKIN:  no rash, atraumatic PSYCH:  Appropriate speech and behavior  Diagnostic and Interventional Summary    Labs Reviewed - No data to display  DG Chest 2 View  Final Result      Medications    albuterol (PROVENTIL HFA;VENTOLIN HFA) 108 (90 Base) MCG/ACT inhaler 2 puff (has no administration in time range)     Procedures Critical Care  ED Course and Medical Decision Making  I have reviewed the triage vital signs and the nursing notes.  Pertinent labs & imaging results that were available during my care of the patient were reviewed by me and considered in my medical decision making (see below for details).  Favored continued symptoms due to influenza virus, chest x-ray with no evidence of secondary bacterial pneumonia.  Not tachycardic, satting well on room air, no evidence of DVT on exam, no chest pain or shortness of breath, little to no concern for PE at this time.  Provided reassurance, return precautions.  After the discussed management above, the patient was determined to be safe for discharge.  The patient was in agreement with this plan and all questions regarding their care were answered.  ED return precautions were discussed and the patient will return to the ED with any significant worsening of condition.   Elmer SowMichael M. Pilar PlateBero, MD [facility] [email or web address]  Final Clinical Impressions(s) / ED Diagnoses     ICD-10-CM   1. Cough R05     ED Discharge Orders    None         Sabas SousBero, Cierria Height M, MD 01/15/18 1335

## 2018-04-01 ENCOUNTER — Other Ambulatory Visit: Payer: Self-pay | Admitting: Family Medicine

## 2018-04-01 DIAGNOSIS — F325 Major depressive disorder, single episode, in full remission: Secondary | ICD-10-CM

## 2018-04-01 NOTE — Telephone Encounter (Signed)
Copied from CRM [id number]. Topic: Quick Communication - Rx Refill/Question >> Apr 01, 2018  7:37 AM Fanny Bien wrote: Medication:levothyroxine (SYNTHROID, LEVOTHROID) 75 MCG tablet [[id number]] escitalopram (LEXAPRO) 10 MG tablet [[id number]]  needs 90 day supply for insurance.    Has the patient contacted their pharmacy? no Preferred Pharmacy (with phone number or street name):[facility], [place] - [address] [phone] (Phone) [phone] (Fax)

## 2018-04-01 NOTE — Telephone Encounter (Signed)
Refill too soon and she already has a 90 supply ordered with refills. Requested Prescriptions  Refused Prescriptions Disp Refills  . levothyroxine (SYNTHROID, LEVOTHROID) 75 MCG tablet 90 tablet 3    Sig: Take 1 tablet (75 mcg total) by mouth daily.     Endocrinology:  Hypothyroid Agents Failed - 04/01/2018  7:53 AM      Failed - TSH needs to be rechecked within 3 months after an abnormal result. Refill until TSH is due.      Passed - TSH in normal range and within 360 days    TSH  Date Value Ref Range Status  11/17/2017 1.16 0.35 - 4.50 uIU/mL Final         Passed - Valid encounter within last 12 months    Recent Outpatient Visits          2 months ago Influenza A   Holiday representative at [facility] [place], [place] R, DO   4 months ago Pain in rib   [employer] [facility], [place], [place]   4 months ago Physical exam   Holiday representative at [facility], Gwenlyn Found, MD   5 months ago Frequent urination   Holiday representative at [facility], [place], [place]   8 months ago Cramping of Engineer, agricultural at [facility], [place], PA-C           . escitalopram (LEXAPRO) 10 MG tablet 90 tablet 3    Sig: Take 1 tablet (10 mg total) by mouth daily.     Psychiatry:  Antidepressants - SSRI Failed - 04/01/2018  7:53 AM      Failed - Completed PHQ-2 or PHQ-9 in the last 360 days.      Passed - Valid encounter within last 6 months    Recent Outpatient Visits          2 months ago Influenza A   Holiday representative at [facility] [place], [place] R, [place]   4 months ago Pain in rib   Holiday representative at [facility], [place], [place]   4 months ago Physical exam   Holiday representative at [facility], Gwenlyn Found, [place]   5 months ago Frequent urination   Ecologist at [facility], [place], [place]   8 months ago Cramping of Engineer, agricultural at [facility], [place], [place]

## 2018-04-20 ENCOUNTER — Other Ambulatory Visit: Payer: Self-pay | Admitting: Family Medicine

## 2018-04-20 DIAGNOSIS — F325 Major depressive disorder, single episode, in full remission: Secondary | ICD-10-CM

## 2018-05-23 ENCOUNTER — Encounter: Payer: Self-pay | Admitting: Family Medicine

## 2018-05-23 ENCOUNTER — Telehealth: Payer: Self-pay

## 2018-05-23 NOTE — Telephone Encounter (Signed)
PA initiated via Covermymeds; KEY: [id number]. Awaiting determination.

## 2018-05-23 NOTE — Telephone Encounter (Signed)
PA denied. Requested medication is not covered by plan benefit. Preferred alternatives: Dexilant, lansoprazole, omeprazole, pantoprazole, rabeprazole.

## 2018-06-07 DIAGNOSIS — M7061 Trochanteric bursitis, right hip: Secondary | ICD-10-CM | POA: Insufficient documentation

## 2018-07-03 ENCOUNTER — Encounter: Payer: Self-pay | Admitting: Family Medicine

## 2018-07-08 NOTE — Telephone Encounter (Signed)
Pt called stating pharmacy had requested auth on Nexium. Advised pt of this note and mychart msg sent by Dr. Lorelei Pont 5/4. Pt stated she refilled another acid reflux medication they had on file at the pharmacy but she didn't know the name. She will check the name when she gets home and call back. She thinks it is one of those listed below she has been taking for 6 weeks and it is not helping her.

## 2018-07-11 MED ORDER — ESOMEPRAZOLE MAGNESIUM 40 MG PO CPDR: DELAYED_RELEASE_CAPSULE | ORAL | 3 refills | Status: DC

## 2018-07-11 NOTE — Addendum Note (Signed)
Addended by: Lamar Blinks C on: 07/11/2018 10:56 AM   Modules accepted: Orders

## 2018-09-21 ENCOUNTER — Other Ambulatory Visit: Payer: Self-pay | Admitting: Family Medicine

## 2018-11-14 NOTE — Progress Notes (Addendum)
[facility] [address], [address] [place], [place] [address] [phone] [phone]  Date:  11/21/2018   Name:  Lindsay SloopConnie Lynn Neal   DOB:  02/17/1965   MRN:  [id number]  PCP:  Pearline Cablesopland, Lindsay C, MD    Chief Complaint: Annual Exam   History of Present Illness:  Lindsay SloopConnie Lynn Neal is a 53 y.o. very pleasant female patient who presents with the following:  Patient with history of hypothyroidism, GERD, sleep apnea, status post bariatric surgery-gastric bypass 2016.  Here today for complete physical Last seen by myself about 1 year ago She had a hip scope in July- this has improved her sx although she is not pain free   She has 2 granddaughters- 4 and 1 yo, the youngest just had her first birthday Takes Lexapro for depression Fenofibrate- not taking since January. Check lipids today  Synthroid 75  Mammogram- done this year, she thinks April per her gyn  Flu shot- done  Pap is up-to-date Tdap up-to-date Cologuard due next year Can suggest Shingrix-she would like to start today  Labs 1 year ago- will today today  She is exercising with yoga and walking No CP or SOB with exercise  She does sometimes need a nap/ feels sleepy during the day.   She will take a nap, but then still feels tired She did use cpap prior to losing weight with her gastric bypass.  She is not aware of snoring currently She did a sleep study perhaps 2 years ago (after bypass surgery) and told she no longer had OSA  However she is not sure if sleep apnea symptoms may have returned  She notes that her right calf is sometimes slightly swollen, this has been the case for over a year.  She did have an ultrasound in June 2019 which was negative for any clot  Patient Active Problem List   Diagnosis Date Noted  . Right hip pain 03/05/2016  . Hypothyroidism due to acquired atrophy of thyroid 02/20/2016  . Status post bariatric surgery 02/20/2016  . 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 GERD (gastroesophageal reflux disease) 02/22/2013  . Dysmetabolic syndrome X 04/19/2012  . Allergic rhinitis 02/23/2012  . Depression 02/23/2012  . Sleep apnea, obstructive 02/23/2012    Past Medical History:  Diagnosis Date  . Arthritis   . Frequent headaches   . GERD (gastroesophageal reflux disease)   . High cholesterol   . HPV (human papilloma virus) infection   . Sleep apnea   . Urine incontinence     Past Surgical History:  Procedure Laterality Date  . CESAREAN SECTION  04/13/1991  . GALLBLADDER SURGERY  10/08/2014  . GASTRIC BYPASS  10/08/2014  . PLANTAR FASCIA SURGERY  2015  . SHOULDER SURGERY Right 2014  . VAGINA SURGERY      Social History   Tobacco Use  . Smoking status: Never Smoker  . Smokeless tobacco: Never Used  Substance Use Topics  . Alcohol use: No  . Drug use: Not on file    Family History  Problem Relation Age of Onset  . Arthritis Mother   . Diabetes Mother   . Elevated Lipids Mother   . Elevated Lipids Father   . Heart disease Father   . Stroke Father   . Hypertension Father   . Heart disease Brother   . Colon cancer Maternal Grandmother   . Diabetes Maternal Grandmother   . Elevated  Lipids Maternal Grandfather   . Arthritis Paternal Grandmother   . Hypertension Paternal Grandmother     Allergies  Allergen Reactions  . Penicillins   . Pollen Extract Other (See Comments)    Stuffy nose  . Sulfa Antibiotics     Medication list has been reviewed and updated.  Current Outpatient Medications on File Prior to Visit  Medication Sig Dispense Refill  . calcium citrate-vitamin D (CITRACAL+D) 315-200 MG-UNIT tablet Take 1 tablet by mouth daily.    . Cholecalciferol (D3-1000) 1000 units tablet     . Cyanocobalamin (B-12) 1000 MCG LOZG Take by mouth.    . esomeprazole (NEXIUM) 40 MG capsule TAKE 1 CAPSULE BY MOUTH DAILY 90 capsule 3  . fenofibrate 160 MG tablet TAKE ONE TABLET BY MOUTH  EVERY DAY 30 tablet 5  . fluticasone (FLONASE) 50 MCG/ACT nasal spray Place 2 sprays into both nostrils daily. 16 g 1  . IRON PO Take 65 mg by mouth daily.    Marland Kitchen levocetirizine (XYZAL) 5 MG tablet Take 1 tablet (5 mg total) by mouth every evening. 30 tablet 0  . levothyroxine (SYNTHROID) 75 MCG tablet TAKE 1 TABLET BY MOUTH DAILY 60 tablet 0  . multivitamin-iron-minerals-folic acid (CENTRUM) chewable tablet Chew by mouth.     No current facility-administered medications on file prior to visit.     Review of Systems:  As per HPI- otherwise negative.   Physical Examination: Vitals:   11/21/18 0815  BP: 110/82  Pulse: 76  Resp: 16  Temp: 97.7 F (36.5 C)  SpO2: 96%   Vitals:   11/21/18 0815  Weight: 152 lb (68.9 kg)  Height: 5\' 4"  (1.626 m)   Body mass index is 26.09 kg/m. Ideal Body Weight: Weight in (lb) to have BMI = 25: 145.3  GEN: WDWN, NAD, Non-toxic, A & O x 3, minimal overweight, looks well HEENT: Atraumatic, Normocephalic. Neck supple. No masses, No LAD.  Bilateral TM wnl, oropharynx normal.  PEERL,EOMI.   Ears and Nose: No external deformity. CV: RRR, No M/G/R. No JVD. No thrill. No extra heart sounds. PULM: CTA B, no wheezes, crackles, rhonchi. No retractions. No resp. distress. No accessory muscle use. ABD: S, NT, ND, +BS. No rebound. No HSM. EXTR: No c/c/e NEURO Normal gait.  PSYCH: Normally interactive. Conversant. Not depressed or anxious appearing.  Calm demeanor.  Right calf: There is minimal fullness of the upper calf which the patient states is chronic, no cords or tenderness, no redness She did break out in a mild rash on her lip and chin after wearing a facemask recently.  No history of HSV  Assessment and Plan: Physical exam  Hypothyroidism due to acquired atrophy of thyroid - Plan: TSH  Screening for deficiency anemia - Plan: CBC  Status post bariatric surgery - Plan: Ferritin, B12 and Folate Panel  Screening for diabetes mellitus - Plan:  Comprehensive metabolic panel, Hemoglobin A1c  Screening for hyperlipidemia - Plan: Lipid panel  Sleep apnea, obstructive  Depression, major, single episode, complete remission (HCC) - Plan: escitalopram (LEXAPRO) 10 MG tablet  Skin rash - Plan: mupirocin cream (BACTROBAN) 2 %  Here today for complete physical Overall doing well Refilled Lexapro, she is happy taking this medication We will check labs today status post bariatric surgery Thyroid level due, refill levothyroxine pending results If her labs do not explain fatigue, would like to have her follow-up for a sleep study First shingles vaccine given today She has no longer taking cholesterol medication, will check  results today Mupirocin for mild rash on face   Signed Lamar Blinks, MD  Received her labs 11/3, message to patient  Results for orders placed or performed in visit on 11/21/18  CBC  Result Value Ref Range   WBC 8.1 4.0 - 10.5 K/uL   RBC 4.54 3.87 - 5.11 Mil/uL   Platelets 300.0 150.0 - 400.0 K/uL   Hemoglobin 13.9 12.0 - 15.0 g/dL   HCT 42.1 36.0 - 46.0 %   MCV 92.7 78.0 - 100.0 fl   MCHC 33.1 30.0 - 36.0 g/dL   RDW 13.1 11.5 - 15.5 %  Comprehensive metabolic panel  Result Value Ref Range   Sodium 140 135 - 145 mEq/L   Potassium 4.5 3.5 - 5.1 mEq/L   Chloride 103 96 - 112 mEq/L   CO2 30 19 - 32 mEq/L   Glucose, Bld 90 70 - 99 mg/dL   BUN 15 6 - 23 mg/dL   Creatinine, Ser 0.62 0.40 - 1.20 mg/dL   Total Bilirubin 0.4 0.2 - 1.2 mg/dL   Alkaline Phosphatase 118 (H) 39 - 117 U/L   AST 26 0 - 37 U/L   ALT 27 0 - 35 U/L   Total Protein 6.7 6.0 - 8.3 g/dL   Albumin 4.2 3.5 - 5.2 g/dL   Calcium 9.4 8.4 - 10.5 mg/dL   GFR 100.85 >60.00 mL/min  Hemoglobin A1c  Result Value Ref Range   Hgb A1c MFr Bld 5.5 4.6 - 6.5 %  Lipid panel  Result Value Ref Range   Cholesterol 200 0 - 200 mg/dL   Triglycerides 97.0 0.0 - 149.0 mg/dL   HDL 49.50 >39.00 mg/dL   VLDL 19.4 0.0 - 40.0 mg/dL   LDL Cholesterol 131  (H) 0 - 99 mg/dL   Total CHOL/HDL Ratio 4    NonHDL 150.71   TSH  Result Value Ref Range   TSH 0.58 0.35 - 4.50 uIU/mL  Ferritin  Result Value Ref Range   Ferritin 88.7 10.0 - 291.0 ng/mL  B12 and Folate Panel  Result Value Ref Range   Vitamin B-12 461 211 - 911 pg/mL   Folate >24.1 >5.9 ng/mL

## 2018-11-14 NOTE — Patient Instructions (Addendum)
It was great to see you again today, I will be in touch with your labs ASAP When I get your TSH level back I will refill your thyroid med You got your first shingles vaccine today- 2nd dose due in 2-6 months   If we don't find any explanation for your fatigue I would suggest that we do a sleep study for you to see if your sleep apnea has returned  Try wearing compression socks for exercise- this may reduce swelling of your right calf    Health Maintenance, Female Adopting a healthy lifestyle and getting preventive care are important in promoting health and wellness. Ask your health care provider about:  The right schedule for you to have regular tests and exams.  Things you can do on your own to prevent diseases and keep yourself healthy. What should I know about diet, weight, and exercise? Eat a healthy diet   Eat a diet that includes plenty of vegetables, fruits, low-fat dairy products, and lean protein.  Do not eat a lot of foods that are high in solid fats, added sugars, or sodium. Maintain a healthy weight Body mass index (BMI) is used to identify weight problems. It estimates body fat based on height and weight. Your health care provider can help determine your BMI and help you achieve or maintain a healthy weight. Get regular exercise Get regular exercise. This is one of the most important things you can do for your health. Most adults should:  Exercise for at least 150 minutes each week. The exercise should increase your heart rate and make you sweat (moderate-intensity exercise).  Do strengthening exercises at least twice a week. This is in addition to the moderate-intensity exercise.  Spend less time sitting. Even light physical activity can be beneficial. Watch cholesterol and blood lipids Have your blood tested for lipids and cholesterol at 52 years of age, then have this test every 5 years. Have your cholesterol levels checked more often if:  Your lipid or cholesterol  levels are high.  You are older than 52 years of age.  You are at high risk for heart disease. What should I know about cancer screening? Depending on your health history and family history, you may need to have cancer screening at various ages. This may include screening for:  Breast cancer.  Cervical cancer.  Colorectal cancer.  Skin cancer.  Lung cancer. What should I know about heart disease, diabetes, and high blood pressure? Blood pressure and heart disease  High blood pressure causes heart disease and increases the risk of stroke. This is more likely to develop in people who have high blood pressure readings, are of African descent, or are overweight.  Have your blood pressure checked: ? Every 3-5 years if you are 27-64 years of age. ? Every year if you are 74 years old or older. Diabetes Have regular diabetes screenings. This checks your fasting blood sugar level. Have the screening done:  Once every three years after age 42 if you are at a normal weight and have a low risk for diabetes.  More often and at a younger age if you are overweight or have a high risk for diabetes. What should I know about preventing infection? Hepatitis B If you have a higher risk for hepatitis B, you should be screened for this virus. Talk with your health care provider to find out if you are at risk for hepatitis B infection. Hepatitis C Testing is recommended for:  Everyone born from [birth year]  through 1965.  Anyone with known risk factors for hepatitis C. Sexually transmitted infections (STIs)  Get screened for STIs, including gonorrhea and chlamydia, if: ? You are sexually active and are younger than 52 years of age. ? You are older than 52 years of age and your health care provider tells you that you are at risk for this type of infection. ? Your sexual activity has changed since you were last screened, and you are at increased risk for chlamydia or gonorrhea. Ask your health care  provider if you are at risk.  Ask your health care provider about whether you are at high risk for HIV. Your health care provider may recommend a prescription medicine to help prevent HIV infection. If you choose to take medicine to prevent HIV, you should first get tested for HIV. You should then be tested every 3 months for as long as you are taking the medicine. Pregnancy  If you are about to stop having your period (premenopausal) and you may become pregnant, seek counseling before you get pregnant.  Take 400 to 800 micrograms (mcg) of folic acid every day if you become pregnant.  Ask for birth control (contraception) if you want to prevent pregnancy. Osteoporosis and menopause Osteoporosis is a disease in which the bones lose minerals and strength with aging. This can result in bone fractures. If you are 39 years old or older, or if you are at risk for osteoporosis and fractures, ask your health care provider if you should:  Be screened for bone loss.  Take a calcium or vitamin D supplement to lower your risk of fractures.  Be given hormone replacement therapy (HRT) to treat symptoms of menopause. Follow these instructions at home: Lifestyle  Do not use any products that contain nicotine or tobacco, such as cigarettes, e-cigarettes, and chewing tobacco. If you need help quitting, ask your health care provider.  Do not use street drugs.  Do not share needles.  Ask your health care provider for help if you need support or information about quitting drugs. Alcohol use  Do not drink alcohol if: ? Your health care provider tells you not to drink. ? You are pregnant, may be pregnant, or are planning to become pregnant.  If you drink alcohol: ? Limit how much you use to 0-1 drink a day. ? Limit intake if you are breastfeeding.  Be aware of how much alcohol is in your drink. In the [place] one drink equals one 12 oz bottle of beer (355 mL), one 5 oz glass of wine (148 mL), or one 1  oz glass of hard liquor (44 mL). General instructions  Schedule regular health, dental, and eye exams.  Stay current with your vaccines.  Tell your health care provider if: ? You often feel depressed. ? You have ever been abused or do not feel safe at home. Summary  Adopting a healthy lifestyle and getting preventive care are important in promoting health and wellness.  Follow your health care provider's instructions about healthy diet, exercising, and getting tested or screened for diseases.  Follow your health care provider's instructions on monitoring your cholesterol and blood pressure. This information is not intended to replace advice given to you by your health care provider. Make sure you discuss any questions you have with your health care provider. Document Released: 07/21/2010 Document Revised: 12/29/2017 Document Reviewed: 12/29/2017 Elsevier Patient Education  2020 [employer].

## 2018-11-18 ENCOUNTER — Other Ambulatory Visit: Payer: Self-pay

## 2018-11-21 ENCOUNTER — Encounter: Payer: Self-pay | Admitting: Family Medicine

## 2018-11-21 ENCOUNTER — Other Ambulatory Visit: Payer: Self-pay

## 2018-11-21 ENCOUNTER — Ambulatory Visit (INDEPENDENT_AMBULATORY_CARE_PROVIDER_SITE_OTHER): Payer: PRIVATE HEALTH INSURANCE | Admitting: Family Medicine

## 2018-11-21 VITALS — BP 110/82 | HR 76 | Temp 97.7°F | Resp 16 | Ht 64.0 in | Wt 152.0 lb

## 2018-11-21 DIAGNOSIS — E034 Atrophy of thyroid (acquired): Secondary | ICD-10-CM

## 2018-11-21 DIAGNOSIS — G4733 Obstructive sleep apnea (adult) (pediatric): Secondary | ICD-10-CM

## 2018-11-21 DIAGNOSIS — Z13220 Encounter for screening for lipoid disorders: Secondary | ICD-10-CM

## 2018-11-21 DIAGNOSIS — F325 Major depressive disorder, single episode, in full remission: Secondary | ICD-10-CM

## 2018-11-21 DIAGNOSIS — Z130 Encounter for screening for diseases of the blood and blood-forming organs and certain disorders involving the immune mechanism: Secondary | ICD-10-CM

## 2018-11-21 DIAGNOSIS — Z23 Encounter for immunization: Secondary | ICD-10-CM

## 2018-11-21 DIAGNOSIS — Z9884 Bariatric surgery status: Secondary | ICD-10-CM | POA: Diagnosis not present

## 2018-11-21 DIAGNOSIS — R21 Rash and other nonspecific skin eruption: Secondary | ICD-10-CM

## 2018-11-21 DIAGNOSIS — Z0000 Encounter for general adult medical examination without abnormal findings: Secondary | ICD-10-CM

## 2018-11-21 DIAGNOSIS — Z131 Encounter for screening for diabetes mellitus: Secondary | ICD-10-CM | POA: Diagnosis not present

## 2018-11-21 LAB — COMPREHENSIVE METABOLIC PANEL
ALT: 27 U/L (ref 0–35)
AST: 26 U/L (ref 0–37)
Albumin: 4.2 g/dL (ref 3.5–5.2)
Alkaline Phosphatase: 118 U/L — ABNORMAL HIGH (ref 39–117)
BUN: 15 mg/dL (ref 6–23)
CO2: 30 mEq/L (ref 19–32)
Calcium: 9.4 mg/dL (ref 8.4–10.5)
Chloride: 103 mEq/L (ref 96–112)
Creatinine, Ser: 0.62 mg/dL (ref 0.40–1.20)
GFR: 100.85 mL/min (ref >60.00)
Glucose, Bld: 90 mg/dL (ref 70–99)
Potassium: 4.5 mEq/L (ref 3.5–5.1)
Sodium: 140 mEq/L (ref 135–145)
Total Bilirubin: 0.4 mg/dL (ref 0.2–1.2)
Total Protein: 6.7 g/dL (ref 6.0–8.3)

## 2018-11-21 LAB — LIPID PANEL
Cholesterol: 200 mg/dL (ref 0–200)
HDL: 49.5 mg/dL (ref >39.00)
LDL Cholesterol: 131 mg/dL — ABNORMAL HIGH (ref 0–99)
NonHDL: 150.71
Total CHOL/HDL Ratio: 4
Triglycerides: 97 mg/dL (ref 0.0–149.0)
VLDL: 19.4 mg/dL (ref 0.0–40.0)

## 2018-11-21 LAB — CBC
HCT: 42.1 % (ref 36.0–46.0)
Hemoglobin: 13.9 g/dL (ref 12.0–15.0)
MCHC: 33.1 g/dL (ref 30.0–36.0)
MCV: 92.7 fl (ref 78.0–100.0)
Platelets: 300 10*3/uL (ref 150.0–400.0)
RBC: 4.54 Mil/uL (ref 3.87–5.11)
RDW: 13.1 % (ref 11.5–15.5)
WBC: 8.1 10*3/uL (ref 4.0–10.5)

## 2018-11-21 LAB — B12 AND FOLATE PANEL
Folate: >24.1 ng/mL (ref >5.9)
Vitamin B-12: 461 pg/mL (ref 211–911)

## 2018-11-21 LAB — TSH: TSH: 0.58 u[IU]/mL (ref 0.35–4.50)

## 2018-11-21 LAB — FERRITIN: Ferritin: 88.7 ng/mL (ref 10.0–291.0)

## 2018-11-21 LAB — HEMOGLOBIN A1C: Hgb A1c MFr Bld: 5.5 % (ref 4.6–6.5)

## 2018-11-21 MED ORDER — ESCITALOPRAM OXALATE 10 MG PO TABS: 10.0000 mg | ORAL_TABLET | Freq: Every day | ORAL | 3 refills | Status: DC

## 2018-11-21 MED ORDER — MUPIROCIN CALCIUM 2 % EX CREA: 1.0000 "application " | TOPICAL_CREAM | Freq: Two times a day (BID) | CUTANEOUS | 0 refills | Status: DC

## 2018-11-22 ENCOUNTER — Encounter: Payer: Self-pay | Admitting: Family Medicine

## 2018-11-22 ENCOUNTER — Other Ambulatory Visit: Payer: Self-pay | Admitting: Family Medicine

## 2018-11-22 DIAGNOSIS — E785 Hyperlipidemia, unspecified: Secondary | ICD-10-CM

## 2018-11-22 DIAGNOSIS — F325 Major depressive disorder, single episode, in full remission: Secondary | ICD-10-CM

## 2018-11-22 DIAGNOSIS — R5382 Chronic fatigue, unspecified: Secondary | ICD-10-CM

## 2018-11-23 MED ORDER — SIMVASTATIN 10 MG PO TABS: 10.0000 mg | ORAL_TABLET | Freq: Every day | ORAL | 3 refills | Status: DC

## 2018-11-23 MED ORDER — ESCITALOPRAM OXALATE 20 MG PO TABS: 20.0000 mg | ORAL_TABLET | Freq: Every day | ORAL | 3 refills | Status: DC

## 2018-11-23 NOTE — Addendum Note (Signed)
Addended by: Lamar Blinks C on: 11/23/2018 05:28 AM   Modules accepted: Orders

## 2018-11-23 NOTE — Addendum Note (Signed)
Addended by: Lamar Blinks C on: 11/23/2018 01:24 PM   Modules accepted: Orders

## 2018-11-29 ENCOUNTER — Other Ambulatory Visit: Payer: Self-pay

## 2018-11-29 ENCOUNTER — Other Ambulatory Visit (INDEPENDENT_AMBULATORY_CARE_PROVIDER_SITE_OTHER): Payer: PRIVATE HEALTH INSURANCE

## 2018-11-29 DIAGNOSIS — R5382 Chronic fatigue, unspecified: Secondary | ICD-10-CM | POA: Diagnosis not present

## 2018-11-30 LAB — SEDIMENTATION RATE: Sed Rate: 13 mm/hr (ref 0–30)

## 2018-11-30 LAB — C-REACTIVE PROTEIN: CRP: <1 mg/dL (ref 0.5–20.0)

## 2018-12-01 ENCOUNTER — Encounter: Payer: Self-pay | Admitting: Family Medicine

## 2018-12-01 LAB — CYCLIC CITRUL PEPTIDE ANTIBODY, IGG: Cyclic Citrullin Peptide Ab: <16 UNITS

## 2018-12-01 LAB — ANA: Anti Nuclear Antibody (ANA): NEGATIVE

## 2018-12-01 LAB — RHEUMATOID FACTOR: Rheumatoid fact SerPl-aCnc: <14 IU/mL (ref <14)

## 2018-12-07 ENCOUNTER — Other Ambulatory Visit: Payer: Self-pay | Admitting: Family Medicine

## 2018-12-07 ENCOUNTER — Encounter: Payer: Self-pay | Admitting: Family Medicine

## 2018-12-07 MED ORDER — HYDROXYZINE PAMOATE 25 MG PO CAPS: ORAL_CAPSULE | ORAL | 1 refills | Status: DC

## 2018-12-12 ENCOUNTER — Encounter: Payer: Self-pay | Admitting: Family Medicine

## 2018-12-12 ENCOUNTER — Telehealth: Payer: Self-pay | Admitting: Family Medicine

## 2018-12-12 MED ORDER — METRONIDAZOLE 1 % EX GEL: Freq: Every day | CUTANEOUS | 0 refills | Status: DC

## 2018-12-12 NOTE — Addendum Note (Signed)
Addended by: Lamar Blinks C on: 12/12/2018 12:54 PM   Modules accepted: Orders

## 2018-12-12 NOTE — Telephone Encounter (Signed)
Called pharmacy- she actually ended up changing to a different pharmacy, they can disregard.  LMOM

## 2018-12-12 NOTE — Telephone Encounter (Signed)
Please advise 

## 2018-12-12 NOTE — Telephone Encounter (Signed)
Pharmacy called over about patient recent medication metroNIDAZOLE (METROGEL) 1 % gel [[id number]]   Dr Lorelei Pont sent over 45 grand tube all pharmacy have is 60 gram . Wanted to make sure that was okay to give patient . Please advise

## 2018-12-14 ENCOUNTER — Encounter: Payer: Self-pay | Admitting: Family Medicine

## 2018-12-18 MED ORDER — VENLAFAXINE HCL ER 75 MG PO CP24: 75.0000 mg | ORAL_CAPSULE | Freq: Every day | ORAL | 3 refills | Status: DC

## 2018-12-18 NOTE — Addendum Note (Signed)
Addended by: Lamar Blinks C on: 12/18/2018 07:05 AM   Modules accepted: Orders

## 2019-01-02 ENCOUNTER — Encounter: Payer: Self-pay | Admitting: Family Medicine

## 2019-01-19 ENCOUNTER — Encounter: Payer: Self-pay | Admitting: Family Medicine

## 2019-01-23 ENCOUNTER — Other Ambulatory Visit: Payer: Self-pay | Admitting: Family Medicine

## 2019-03-16 ENCOUNTER — Other Ambulatory Visit: Payer: Self-pay

## 2019-03-16 ENCOUNTER — Ambulatory Visit (INDEPENDENT_AMBULATORY_CARE_PROVIDER_SITE_OTHER): Payer: Managed Care, Other (non HMO)

## 2019-03-16 DIAGNOSIS — Z23 Encounter for immunization: Secondary | ICD-10-CM | POA: Diagnosis not present

## 2019-05-01 ENCOUNTER — Telehealth: Payer: Self-pay
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
## 2019-05-02 ENCOUNTER — Encounter: Payer: Self-pay | Admitting: Family Medicine

## 2019-05-02 MED ORDER — VENLAFAXINE HCL ER 75 MG PO CP24: 150.0000 mg | ORAL_CAPSULE | Freq: Every day | ORAL | 6 refills | Status: DC

## 2019-05-24 ENCOUNTER — Other Ambulatory Visit: Payer: Self-pay | Admitting: Family Medicine

## 2019-05-30 ENCOUNTER — Encounter: Payer: Self-pay | Admitting: Family Medicine

## 2019-05-30 NOTE — Progress Notes (Deleted)
[facility] [address], [address] [place], [place] [address] [phone]  Date:  06/01/2019   Name:  Lindsay Neal   DOB:  Oct 04, 1965   MRN:  [id number]  PCP:  Pearline Cables, MD    Chief Complaint: No chief complaint on file.   History of Present Illness:  Lindsay Neal is a 54 y.o. very pleasant female patient who presents with the following:  Pt with history of hypothyroidism, bariatric surgery, depression  Here today with concern of   Due for cologuard later this year  covid series   Patient Active Problem List   Diagnosis Date Noted  . Right hip pain 03/05/2016  . Hypothyroidism due to acquired atrophy of thyroid 02/20/2016  . Status post bariatric surgery 02/20/2016  . 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 GERD (gastroesophageal reflux disease) 02/22/2013  . Dysmetabolic syndrome X 04/19/2012  . Allergic rhinitis 02/23/2012  . Depression 02/23/2012  . Sleep apnea, obstructive 02/23/2012    Past Medical History:  Diagnosis Date  . Arthritis   . Frequent headaches   . GERD (gastroesophageal reflux disease)   . High cholesterol   . HPV (human papilloma virus) infection   . Sleep apnea   . Urine incontinence     Past Surgical History:  Procedure Laterality Date  . CESAREAN SECTION  04/13/1991  . GALLBLADDER SURGERY  10/08/2014  . GASTRIC BYPASS  10/08/2014  . PLANTAR FASCIA SURGERY  2015  . SHOULDER SURGERY Right 2014  . VAGINA SURGERY      Social History   Tobacco Use  . Smoking status: Never Smoker  . Smokeless tobacco: Never Used  Substance Use Topics  . Alcohol use: No  . Drug use: Not on file    Family History  Problem Relation Age of Onset  . Arthritis Mother   . Diabetes Mother   . Elevated Lipids Mother   . Elevated Lipids Father   . Heart disease Father   . Stroke Father   . Hypertension Father   . Heart disease Brother   . Colon  cancer Maternal Grandmother   . Diabetes Maternal Grandmother   . Elevated Lipids Maternal Grandfather   . Arthritis Paternal Grandmother   . Hypertension Paternal Grandmother     Allergies  Allergen Reactions  . Penicillins   . Pollen Extract Other (See Comments)    Stuffy nose  . Sulfa Antibiotics     Medication list has been reviewed and updated.  Current Outpatient Medications on File Prior to Visit  Medication Sig Dispense Refill  . calcium citrate-vitamin D (CITRACAL+D) 315-200 MG-UNIT tablet Take 1 tablet by mouth daily.    . Cholecalciferol (D3-1000) 1000 units tablet     . Cyanocobalamin (B-12) 1000 MCG LOZG Take by mouth.    . escitalopram (LEXAPRO) 20 MG tablet Take 1 tablet (20 mg total) by mouth daily. 90 tablet 3  . esomeprazole (NEXIUM) 40 MG capsule TAKE 1 CAPSULE BY MOUTH DAILY 90 capsule 3  . fluticasone (FLONASE) 50 MCG/ACT nasal spray Place 2 sprays into both nostrils daily. 16 g 1  . hydrOXYzine (VISTARIL) 25 MG capsule Take 1 or 2 at bedtime as needed for insomnia 60 capsule 1  . IRON PO Take 65 mg by mouth daily.    Marland Kitchen levocetirizine (XYZAL) 5 MG tablet Take 1 tablet (5 mg total) by mouth every evening. 30 tablet 0  . levothyroxine (  SYNTHROID) 75 MCG tablet Take 1 tablet by mouth once daily 30 tablet 5  . metroNIDAZOLE (METROGEL) 1 % gel Apply topically daily. Use as needed for rash 45 g 0  . multivitamin-iron-minerals-folic acid (CENTRUM) chewable tablet Chew by mouth.    . mupirocin cream (BACTROBAN) 2 % Apply 1 application topically 2 (two) times daily. Use as needed for skin rash 15 g 0  . simvastatin (ZOCOR) 10 MG tablet Take 1 tablet (10 mg total) by mouth at bedtime. 90 tablet 3  . venlafaxine XR (EFFEXOR XR) 75 MG 24 hr capsule Take 2 capsules (150 mg total) by mouth daily with breakfast. 60 capsule 6   No current facility-administered medications on file prior to visit.    Review of Systems:  As per HPI- otherwise negative.   Physical  Examination: There were no vitals filed for this visit. There were no vitals filed for this visit. There is no height or weight on file to calculate BMI. Ideal Body Weight:    GEN: no acute distress. HEENT: Atraumatic, Normocephalic.  Ears and Nose: No external deformity. CV: RRR, No M/G/R. No JVD. No thrill. No extra heart sounds. PULM: CTA B, no wheezes, crackles, rhonchi. No retractions. No resp. distress. No accessory muscle use. ABD: S, NT, ND, +BS. No rebound. No HSM. EXTR: No c/c/e PSYCH: Normally interactive. Conversant.    Assessment and Plan: *** This visit occurred during the SARS-CoV-2 public health emergency.  Safety protocols were in place, including screening questions prior to the visit, additional usage of staff PPE, and extensive cleaning of exam room while observing appropriate contact time as indicated for disinfecting solutions.    Signed Lamar Blinks, MD

## 2019-06-01 ENCOUNTER — Ambulatory Visit: Payer: Managed Care, Other (non HMO) | Admitting: Family Medicine

## 2019-06-07 NOTE — Progress Notes (Deleted)
[facility] at [facility] [address], [address] [place], [place] [address] [phone]  Date:  06/08/2019   Name:  Lindsay Neal   DOB:  [birth year]   MRN:  [id number]  PCP:  Pearline Cables, MD    Chief Complaint: No chief complaint on file.   History of Present Illness:  Lindsay Neal is a 54 y.o. very pleasant female patient who presents with the following:  Here today to discuss BP and dizziness.  Last seen her myself in November for complete physical Patient with history of hypothyroidism, GERD, sleep apnea, status post bariatric surgery-gastric bypass 2016   COVID-19 vaccine Pap  Lexapro Nexium Hydroxyzine as needed Synthroid Simvastatin Venlafaxine  Lab Results  Component Value Date   TSH 0.58 11/21/2018    Patient Active Problem List   Diagnosis Date Noted  . Right hip pain 03/05/2016  . Hypothyroidism due to acquired atrophy of thyroid 02/20/2016  . Status post bariatric surgery 02/20/2016  . 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 GERD (gastroesophageal reflux disease) 02/22/2013  . Dysmetabolic syndrome X 04/19/2012  . Allergic rhinitis 02/23/2012  . Depression 02/23/2012  . Sleep apnea, obstructive 02/23/2012    Past Medical History:  Diagnosis Date  . Arthritis   . Frequent headaches   . GERD (gastroesophageal reflux disease)   . High cholesterol   . HPV (human papilloma virus) infection   . Sleep apnea   . Urine incontinence     Past Surgical History:  Procedure Laterality Date  . CESAREAN SECTION  04/13/1991  . GALLBLADDER SURGERY  10/08/2014  . GASTRIC BYPASS  10/08/2014  . PLANTAR FASCIA SURGERY  2015  . SHOULDER SURGERY Right 2014  . VAGINA SURGERY      Social History   Tobacco Use  . Smoking status: Never Smoker  . Smokeless tobacco: Never Used  Substance Use Topics  . Alcohol use: No  . Drug use: Not on file    Family History  Problem  Relation Age of Onset  . Arthritis Mother   . Diabetes Mother   . Elevated Lipids Mother   . Elevated Lipids Father   . Heart disease Father   . Stroke Father   . Hypertension Father   . Heart disease Brother   . Colon cancer Maternal Grandmother   . Diabetes Maternal Grandmother   . Elevated Lipids Maternal Grandfather   . Arthritis Paternal Grandmother   . Hypertension Paternal Grandmother     Allergies  Allergen Reactions  . Penicillins   . Pollen Extract Other (See Comments)    Stuffy nose  . Sulfa Antibiotics     Medication list has been reviewed and updated.  Current Outpatient Medications on File Prior to Visit  Medication Sig Dispense Refill  . calcium citrate-vitamin D (CITRACAL+D) 315-200 MG-UNIT tablet Take 1 tablet by mouth daily.    . Cholecalciferol (D3-1000) 1000 units tablet     . Cyanocobalamin (B-12) 1000 MCG LOZG Take by mouth.    . escitalopram (LEXAPRO) 20 MG tablet Take 1 tablet (20 mg total) by mouth daily. 90 tablet 3  . esomeprazole (NEXIUM) 40 MG capsule TAKE 1 CAPSULE BY MOUTH DAILY 90 capsule 3  . fluticasone (FLONASE) 50 MCG/ACT nasal spray Place 2 sprays into both nostrils daily. 16 g 1  . hydrOXYzine (VISTARIL) 25 MG capsule Take 1 or 2 at bedtime as needed for insomnia 60 capsule 1  .  IRON PO Take 65 mg by mouth daily.    Marland Kitchen levocetirizine (XYZAL) 5 MG tablet Take 1 tablet (5 mg total) by mouth every evening. 30 tablet 0  . levothyroxine (SYNTHROID) 75 MCG tablet Take 1 tablet by mouth once daily 30 tablet 5  . metroNIDAZOLE (METROGEL) 1 % gel Apply topically daily. Use as needed for rash 45 g 0  . multivitamin-iron-minerals-folic acid (CENTRUM) chewable tablet Chew by mouth.    . mupirocin cream (BACTROBAN) 2 % Apply 1 application topically 2 (two) times daily. Use as needed for skin rash 15 g 0  . simvastatin (ZOCOR) 10 MG tablet Take 1 tablet (10 mg total) by mouth at bedtime. 90 tablet 3  . venlafaxine XR (EFFEXOR XR) 75 MG 24 hr capsule  Take 2 capsules (150 mg total) by mouth daily with breakfast. 60 capsule 6   No current facility-administered medications on file prior to visit.    Review of Systems:  As per HPI- otherwise negative.   Physical Examination: There were no vitals filed for this visit. There were no vitals filed for this visit. There is no height or weight on file to calculate BMI. Ideal Body Weight:    GEN: no acute distress. HEENT: Atraumatic, Normocephalic.  Ears and Nose: No external deformity. CV: RRR, No M/G/R. No JVD. No thrill. No extra heart sounds. PULM: CTA B, no wheezes, crackles, rhonchi. No retractions. No resp. distress. No accessory muscle use. ABD: S, NT, ND, +BS. No rebound. No HSM. EXTR: No c/c/e PSYCH: Normally interactive. Conversant.    Assessment and Plan: *** This visit occurred during the SARS-CoV-2 public health emergency.  Safety protocols were in place, including screening questions prior to the visit, additional usage of staff PPE, and extensive cleaning of exam room while observing appropriate contact time as indicated for disinfecting solutions.    Signed Lamar Blinks, MD

## 2019-06-08 ENCOUNTER — Ambulatory Visit: Payer: Managed Care, Other (non HMO) | Admitting: Family Medicine

## 2019-07-27 ENCOUNTER — Encounter: Payer: Self-pay | Admitting: Family Medicine

## 2019-07-27 MED ORDER — HYDROXYZINE PAMOATE 25 MG PO CAPS: ORAL_CAPSULE | ORAL | 3 refills | Status: DC

## 2019-09-20 ENCOUNTER — Encounter: Payer: Self-pay | Admitting: Family Medicine

## 2019-10-03 ENCOUNTER — Telehealth: Payer: Self-pay | Admitting: Family Medicine

## 2019-10-03 NOTE — Telephone Encounter (Signed)
Pt dropped of [employer] form for PCP to sign.  Pt would like for it to be faxed to the number on form  [phone])  Paperwork put into [place] folder up at front desk

## 2019-10-03 NOTE — Telephone Encounter (Signed)
Have not received paperwork yet but will be on the lookout. FYI.

## 2019-10-04 NOTE — Telephone Encounter (Signed)
FMLA forms have been completed. Faxed to company for patient.

## 2019-10-12 ENCOUNTER — Encounter: Payer: Self-pay | Admitting: Family Medicine

## 2019-10-12 ENCOUNTER — Ambulatory Visit (INDEPENDENT_AMBULATORY_CARE_PROVIDER_SITE_OTHER): Payer: Managed Care, Other (non HMO) | Admitting: Family Medicine

## 2019-10-12 ENCOUNTER — Other Ambulatory Visit: Payer: Self-pay

## 2019-10-12 VITALS — Resp 16 | Ht 64.0 in | Wt 153.0 lb

## 2019-10-12 DIAGNOSIS — Z114 Encounter for screening for human immunodeficiency virus [HIV]: Secondary | ICD-10-CM | POA: Diagnosis not present

## 2019-10-12 DIAGNOSIS — Z1159 Encounter for screening for other viral diseases: Secondary | ICD-10-CM | POA: Diagnosis not present

## 2019-10-12 DIAGNOSIS — R5382 Chronic fatigue, unspecified: Secondary | ICD-10-CM

## 2019-10-12 DIAGNOSIS — E034 Atrophy of thyroid (acquired): Secondary | ICD-10-CM

## 2019-10-12 DIAGNOSIS — Z131 Encounter for screening for diabetes mellitus: Secondary | ICD-10-CM

## 2019-10-12 DIAGNOSIS — F329 Major depressive disorder, single episode, unspecified: Secondary | ICD-10-CM | POA: Diagnosis not present

## 2019-10-12 DIAGNOSIS — E785 Hyperlipidemia, unspecified: Secondary | ICD-10-CM

## 2019-10-12 MED ORDER — BUPROPION HCL ER (SR) 150 MG PO TB12: 150.0000 mg | ORAL_TABLET | Freq: Two times a day (BID) | ORAL | 3 refills | Status: DC

## 2019-10-12 NOTE — Patient Instructions (Addendum)
It was good to see you again today, I am sorry you are having such a hard time!   Please call [employer] and see if they will cover cologuard for you- if so I will order  I will be in touch with your labs Let's change your from lexapro / escitalopram to wellbutrin  Take a 1/2 dose of lexapro for 4 days, then stop taking this and start on wellbutrin Take the wellbutrin just ONCE a day (am) for the first 3 days, then increase to twice a day  Please let me know how this works for you- I know we are seeing each other in November but let me know sooner if you rae not doing ok

## 2019-10-12 NOTE — Progress Notes (Addendum)
[facility] at [facility] [address], [address] [place], [place] [address] [phone]  Date:  10/12/2019   Name:  Lindsay Neal   DOB:  11-Mar-1965   MRN:  [id number]  PCP:  Pearline Cables, MD    Chief Complaint: Depression (lack of emotions, no appetitie, sleeping all day) and Trouble Concentrating   History of Present Illness:  Lindsay Neal is a 54 y.o. very pleasant female patient who presents with the following:  Patient today for a follow-up visit and to discuss depression- history of hypothyroidism, GERD, sleep apnea, status post bariatric surgery-gastric bypass 2016 Last seen by myself in person back in November 2020, although we have exchanged several messages more recently She is currently taking lexapro 20 hydroxyzine at bedtime when needed for insomnia   She sent me the following message on September 1 Good afternoon,  I wanted to see if anyway I could get a doctors note to be out of work for a little while.  I am so stressed out right now, I am making errors at work I normally would not make.  I am dealing with my baby brother being in jail having medical issues of his own, coding multiple times the first couple weeks there, severe skin infection and now just him calling my mom and stressing her out.  I have been helping take care of things at my mom/dads, trying to raise monies to get my brother out of jail on bond.  Keep my mom from stressing out so bad, this going to be death of her.   I also was kicked out of house beginning of august because of marital issues.  I am back at home now and we are trying to work out things, but with all this stuff going on and not getting much rest, being able to focus, etc,.  I feel like I just need a little time off to deal with me and recoup a little bit.  Please advise if this is something that you may be able to do for me ?  if not, what do I need to do...     Sincerely,  Lindsay Neal  I  completed FMLA paperwork for her  Her brother is still in jail- trial is next month Her mother is taking this hard She did reconcile with her husband and is back in her home which is a good thing Pt notes that she often feels tired.  Her appetite for meals is not good She is sleeping ok at night- however she does not feel motivated to do much She does not feel like getting together with friends.  Concentration at work is more difficult  She has stopped using caffeine pills She has been tested for OSA and been negative since she had a nasal surgery  No SI- she does not feel esp anxious, just unmotivated   Wt Readings from Last 3 Encounters:  10/12/19 153 lb (69.4 kg)  11/21/18 152 lb (68.9 kg)  01/15/18 155 lb (70.3 kg)    Can offer routine blood work- so far today had a protein shake  Flu vaccine- she is getting her 2nd covid vaccine today so would like to derfe COVID-19 vaccine HIV and hep C screening Pap smear- she is planning a CPE in November  Her cologuard was not covered in 2018;  Patient Active Problem List   Diagnosis Date Noted  . Right hip pain 03/05/2016  . Hypothyroidism  due to acquired atrophy of thyroid 02/20/2016  . Status post bariatric surgery 02/20/2016  . 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 GERD (gastroesophageal reflux disease) 02/22/2013  . Dysmetabolic syndrome X 04/19/2012  . Allergic rhinitis 02/23/2012  . Depression 02/23/2012  . Sleep apnea, obstructive 02/23/2012    Past Medical History:  Diagnosis Date  . Arthritis   . Frequent headaches   . GERD (gastroesophageal reflux disease)   . High cholesterol   . HPV (human papilloma virus) infection   . Sleep apnea   . Urine incontinence     Past Surgical History:  Procedure Laterality Date  . CESAREAN SECTION  04/13/1991  . GALLBLADDER SURGERY  10/08/2014  . GASTRIC BYPASS  10/08/2014  . PLANTAR FASCIA SURGERY  2015  . SHOULDER SURGERY Right 2014  . VAGINA  SURGERY      Social History   Tobacco Use  . Smoking status: Never Smoker  . Smokeless tobacco: Never Used  Substance Use Topics  . Alcohol use: No  . Drug use: Not on file    Family History  Problem Relation Age of Onset  . Arthritis Mother   . Diabetes Mother   . Elevated Lipids Mother   . Elevated Lipids Father   . Heart disease Father   . Stroke Father   . Hypertension Father   . Heart disease Brother   . Colon cancer Maternal Grandmother   . Diabetes Maternal Grandmother   . Elevated Lipids Maternal Grandfather   . Arthritis Paternal Grandmother   . Hypertension Paternal Grandmother     Allergies  Allergen Reactions  . Penicillins   . Pollen Extract Other (See Comments)    Stuffy nose  . Sulfa Antibiotics     Medication list has been reviewed and updated.  Current Outpatient Medications on File Prior to Visit  Medication Sig Dispense Refill  . calcium citrate-vitamin D (CITRACAL+D) 315-200 MG-UNIT tablet Take 1 tablet by mouth daily.    . Cholecalciferol (D3-1000) 1000 units tablet     . Cyanocobalamin (B-12) 1000 MCG LOZG Take by mouth.    . escitalopram (LEXAPRO) 20 MG tablet Take 1 tablet (20 mg total) by mouth daily. 90 tablet 3  . esomeprazole (NEXIUM) 40 MG capsule TAKE 1 CAPSULE BY MOUTH DAILY 90 capsule 3  . hydrOXYzine (VISTARIL) 25 MG capsule Take 1 or 2 at bedtime as needed for insomnia 60 capsule 3  . IRON PO Take 65 mg by mouth daily.    Marland Kitchen levocetirizine (XYZAL) 5 MG tablet Take 1 tablet (5 mg total) by mouth every evening. 30 tablet 0  . levothyroxine (SYNTHROID) 75 MCG tablet Take 1 tablet by mouth once daily 30 tablet 5  . simvastatin (ZOCOR) 10 MG tablet Take 1 tablet (10 mg total) by mouth at bedtime. 90 tablet 3   No current facility-administered medications on file prior to visit.    Review of Systems:  As per HPI- otherwise negative.   Physical Examination: Vitals:   10/12/19 0904  BP: 108/64  Pulse: (!) 58  Resp: 16  SpO2:  97%   Vitals:   10/12/19 0904  Weight: 153 lb (69.4 kg)  Height: 5\' 4"  (1.626 m)   Body mass index is 26.26 kg/m. Ideal Body Weight: Weight in (lb) to have BMI = 25: 145.3  GEN: no acute distress.  Mild overweight, looks well  HEENT: Atraumatic, Normocephalic.  Ears and Nose: No external deformity. CV: RRR, No M/G/R. No JVD. No  thrill. No extra heart sounds. PULM: CTA B, no wheezes, crackles, rhonchi. No retractions. No resp. distress. No accessory muscle use. ABD: S, NT, ND, +BS. No rebound. No HSM. EXTR: No c/c/e PSYCH: Normally interactive. Conversant.   Orthostatic BP and pulse normal today Assessment and Plan: Reactive depression - Plan: buPROPion (WELLBUTRIN SR) 150 MG 12 hr tablet  Encounter for hepatitis C screening test for low risk patient - Plan: Hepatitis C antibody  Screening for HIV (human immunodeficiency virus) - Plan: HIV Antibody (routine testing w rflx)  Hypothyroidism due to acquired atrophy of thyroid  Chronic fatigue - Plan: CBC, TSH, VITAMIN D 25 Hydroxy (Vit-D Deficiency, Fractures), Vitamin B12, Ferritin  Dyslipidemia - Plan: Lipid panel  Screening for diabetes mellitus - Plan: Comprehensive metabolic panel, Hemoglobin A1c  Following up today Pt is going through some major stressors, her brother is in jail right now She notes sx of depression/ denies any SI lexapro is not helping her like we would like Will DC this med and transition onto wellbutrin for her  See patient instructions for further details Will plan further follow- up pending labs.  This visit occurred during the SARS-CoV-2 public health emergency.  Safety protocols were in place, including screening questions prior to the visit, additional usage of staff PPE, and extensive cleaning of exam room while observing appropriate contact time as indicated for disinfecting solutions.     Signed Abbe Amsterdam, MD  Addendum 9/24, received her labs as below.  Message to  patient  Results for orders placed or performed in visit on 10/12/19  CBC  Result Value Ref Range   WBC 6.0 3.8 - 10.8 Thousand/uL   RBC 4.50 3.80 - 5.10 Million/uL   Hemoglobin 13.8 11.7 - 15.5 g/dL   HCT 59.1 35 - 45 %   MCV 93.8 80.0 - 100.0 fL   MCH 30.7 27.0 - 33.0 pg   MCHC 32.7 32.0 - 36.0 g/dL   RDW 63.8 46.6 - 59.9 %   Platelets 256 140 - 400 Thousand/uL   MPV 9.4 7.5 - 12.5 fL  Comprehensive metabolic panel  Result Value Ref Range   Glucose, Bld 78 65 - 99 mg/dL   BUN 19 7 - 25 mg/dL   Creat 3.57 0.17 - 7.93 mg/dL   BUN/Creatinine Ratio NOT APPLICABLE 6 - 22 (calc)   Sodium 141 135 - 146 mmol/L   Potassium 4.5 3.5 - 5.3 mmol/L   Chloride 107 98 - 110 mmol/L   CO2 29 20 - 32 mmol/L   Calcium 9.4 8.6 - 10.4 mg/dL   Total Protein 6.8 6.1 - 8.1 g/dL   Albumin 4.2 3.6 - 5.1 g/dL   Globulin 2.6 1.9 - 3.7 g/dL (calc)   AG Ratio 1.6 1.0 - 2.5 (calc)   Total Bilirubin 0.3 0.2 - 1.2 mg/dL   Alkaline phosphatase (APISO) 110 37 - 153 U/L   AST 46 (H) 10 - 35 U/L   ALT 41 (H) 6 - 29 U/L  Hemoglobin A1c  Result Value Ref Range   Hgb A1c MFr Bld 5.2 <5.7 % of total Hgb   Mean Plasma Glucose 103 (calc)   eAG (mmol/L) 5.7 (calc)  Lipid panel  Result Value Ref Range   Cholesterol 154 <200 mg/dL   HDL 58 > OR = 50 mg/dL   Triglycerides 903 <009 mg/dL   LDL Cholesterol (Calc) 77 mg/dL (calc)   Total CHOL/HDL Ratio 2.7 <5.0 (calc)   Non-HDL Cholesterol (Calc) 96 <233 mg/dL (calc)  TSH  Result Value Ref Range   TSH 1.72 mIU/L  VITAMIN D 25 Hydroxy (Vit-D Deficiency, Fractures)  Result Value Ref Range   Vit D, 25-Hydroxy 37 30 - 100 ng/mL  Vitamin B12  Result Value Ref Range   Vitamin B-12 486 200 - 1,100 pg/mL  Ferritin  Result Value Ref Range   Ferritin 99 16 - 232 ng/mL

## 2019-10-13 ENCOUNTER — Encounter: Payer: Self-pay | Admitting: Family Medicine

## 2019-10-13 LAB — COMPREHENSIVE METABOLIC PANEL
AG Ratio: 1.6 (calc) (ref 1.0–2.5)
ALT: 41 U/L — ABNORMAL HIGH (ref 6–29)
AST: 46 U/L — ABNORMAL HIGH (ref 10–35)
Albumin: 4.2 g/dL (ref 3.6–5.1)
Alkaline phosphatase (APISO): 110 U/L (ref 37–153)
BUN: 19 mg/dL (ref 7–25)
CO2: 29 mmol/L (ref 20–32)
Calcium: 9.4 mg/dL (ref 8.6–10.4)
Chloride: 107 mmol/L (ref 98–110)
Creat: 0.65 mg/dL (ref 0.50–1.05)
Globulin: 2.6 g/dL (calc) (ref 1.9–3.7)
Glucose, Bld: 78 mg/dL (ref 65–99)
Potassium: 4.5 mmol/L (ref 3.5–5.3)
Sodium: 141 mmol/L (ref 135–146)
Total Bilirubin: 0.3 mg/dL (ref 0.2–1.2)
Total Protein: 6.8 g/dL (ref 6.1–8.1)

## 2019-10-13 LAB — HEMOGLOBIN A1C
Hgb A1c MFr Bld: 5.2 % of total Hgb (ref <5.7)
Mean Plasma Glucose: 103 (calc)
eAG (mmol/L): 5.7 (calc)

## 2019-10-13 LAB — CBC
HCT: 42.2 % (ref 35.0–45.0)
Hemoglobin: 13.8 g/dL (ref 11.7–15.5)
MCH: 30.7 pg (ref 27.0–33.0)
MCHC: 32.7 g/dL (ref 32.0–36.0)
MCV: 93.8 fL (ref 80.0–100.0)
MPV: 9.4 fL (ref 7.5–12.5)
Platelets: 256 10*3/uL (ref 140–400)
RBC: 4.5 10*6/uL (ref 3.80–5.10)
RDW: 11.7 % (ref 11.0–15.0)
WBC: 6 10*3/uL (ref 3.8–10.8)

## 2019-10-13 LAB — LIPID PANEL
Cholesterol: 154 mg/dL (ref <200)
HDL: 58 mg/dL (ref >=50)
LDL Cholesterol (Calc): 77 mg/dL (calc)
Non-HDL Cholesterol (Calc): 96 mg/dL (calc) (ref <130)
Total CHOL/HDL Ratio: 2.7 (calc) (ref <5.0)
Triglycerides: 104 mg/dL (ref <150)

## 2019-10-13 LAB — VITAMIN D 25 HYDROXY (VIT D DEFICIENCY, FRACTURES): Vit D, 25-Hydroxy: 37 ng/mL (ref 30–100)

## 2019-10-13 LAB — HEPATITIS C ANTIBODY
Hepatitis C Ab: NONREACTIVE
SIGNAL TO CUT-OFF: 0.01 (ref <1.00)

## 2019-10-13 LAB — TSH: TSH: 1.72 mIU/L

## 2019-10-13 LAB — VITAMIN B12: Vitamin B-12: 486 pg/mL (ref 200–1100)

## 2019-10-13 LAB — FERRITIN: Ferritin: 99 ng/mL (ref 16–232)

## 2019-10-13 LAB — HIV ANTIBODY (ROUTINE TESTING W REFLEX): HIV 1&2 Ab, 4th Generation: NONREACTIVE

## 2019-10-18 ENCOUNTER — Other Ambulatory Visit: Payer: Self-pay | Admitting: Family Medicine

## 2019-10-18 ENCOUNTER — Ambulatory Visit: Payer: Managed Care, Other (non HMO) | Admitting: Family Medicine

## 2019-10-18 ENCOUNTER — Encounter: Payer: Self-pay | Admitting: Family Medicine

## 2019-10-19 NOTE — Telephone Encounter (Signed)
I called her to check in She notes that yesterday she was nauseated, felt lightheaded and sweaty She feels a better today than she did yesterday, but still lightheaded.  I wonder if we tapered her off Lexapro too quickly.  We will have her start back on a half dose of Lexapro daily for 5 days, then every other days for 5 days.  Following that, she can switch onto Wellbutrin She will let me know if not doing okay, or if this is not helping

## 2019-10-25 ENCOUNTER — Encounter: Payer: Self-pay | Admitting: Family Medicine

## 2019-10-25 NOTE — Telephone Encounter (Signed)
Please advise. Pt has questions on mucinex use

## 2019-10-30 ENCOUNTER — Telehealth: Payer: Self-pay | Admitting: Family Medicine

## 2019-10-30 NOTE — Telephone Encounter (Signed)
CallerJahnavi Neal Call Back # [phone]  Patient called today in regards to the dosage instructions for taking the Wellbutrin upon stopping taking the Lexapro... Patient requested a call from the clinical staff to discuss this matter; Please advise

## 2019-10-31 NOTE — Telephone Encounter (Signed)
Patient states she messaged Dr. Patsy Lager on [place]. Her concerns have been resolved.

## 2019-11-01 ENCOUNTER — Encounter: Payer: Self-pay | Admitting: Family Medicine

## 2019-11-02 ENCOUNTER — Telehealth: Payer: Self-pay | Admitting: Family Medicine

## 2019-11-02 NOTE — Telephone Encounter (Signed)
Called Lindsay Neal back- at this time she feels like her job is the main stressor. She states she is safe from any self harm  For the time being she would like to continue taking wellbutrin a bit longer and give it a chance.  She will keep me closely posted about her progress JC

## 2019-11-02 NOTE — Telephone Encounter (Signed)
Tried calling John back, no answer. Voicemail full, unable to leave message.

## 2019-11-02 NOTE — Telephone Encounter (Signed)
Spoke with patients husband. He states He is pretty much at his Witts end with Alayasia and does not know what to do with her. He states she is falling to pieces, non stop crying. He is concerned that possibly pcp does not fully understand what is going on with her. She is under a significant amount of stress. She works from home however their software system has changed and none of her coworkers will take the time to help her. She is having trouble focusing and doesn't quite understand work changed at the moment.   He is unsure if this is all medication related. She stopped the lexapro but possibly was stopped to quickly as she started experiencing withdrawal symptoms. So she started taking it again-now has come off completely. She started Wellbutrin and she is to start taking 2 pills daily today. She does not see a counselor or therapist for this reason however her and her husband see a marriage couselor. No suicidal thoughts, no intent to harm herself or others. Husband would just like some advice on what to do. He would prefer  You call him at [phone] and not her.

## 2019-11-02 NOTE — Telephone Encounter (Signed)
Called John back- he explained that Lindsay Neal has been under a lot of stress the last several months- work, family, marriage concerns He feels that her current work is not tolerable for her.  She finished coming off lexapro and then started on wellbutrin.  She has increased to taking this twice a day just the last 3 days However, she seems to be doing poorly   He states that she is not suicidal and does not seem to be at risk of self harm She does have FMLA - she can take up to 8 days out per month as needed.  He will remind her of this   I will call patient to discuss with her directly

## 2019-11-02 NOTE — Telephone Encounter (Signed)
Caller name: Jonny Ruiz Call back number: [phone]  Jonny Ruiz states his wife is crying a lot, stress out, unable to focus. Husband is concerned and would like some advise.

## 2019-11-07 ENCOUNTER — Encounter: Payer: Self-pay | Admitting: Family Medicine

## 2019-11-10 ENCOUNTER — Encounter: Payer: Self-pay | Admitting: Family Medicine

## 2019-11-10 NOTE — Telephone Encounter (Signed)
Patient scheduled for 11/20/19 at 2:40 for arm and neck pain

## 2019-11-15 ENCOUNTER — Encounter: Payer: Self-pay | Admitting: Family Medicine
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
## 2019-11-20 ENCOUNTER — Encounter: Payer: Self-pay | Admitting: Family Medicine

## 2019-11-20 ENCOUNTER — Other Ambulatory Visit: Payer: Self-pay

## 2019-11-20 ENCOUNTER — Other Ambulatory Visit: Payer: Self-pay | Admitting: Family Medicine

## 2019-11-20 ENCOUNTER — Ambulatory Visit (INDEPENDENT_AMBULATORY_CARE_PROVIDER_SITE_OTHER): Payer: Managed Care, Other (non HMO) | Admitting: Family Medicine

## 2019-11-20 VITALS — BP 112/72 | HR 90 | Resp 17 | Ht 64.0 in | Wt 151.0 lb

## 2019-11-20 DIAGNOSIS — R5382 Chronic fatigue, unspecified: Secondary | ICD-10-CM

## 2019-11-20 DIAGNOSIS — R52 Pain, unspecified: Secondary | ICD-10-CM | POA: Diagnosis not present

## 2019-11-20 DIAGNOSIS — R748 Abnormal levels of other serum enzymes: Secondary | ICD-10-CM | POA: Diagnosis not present

## 2019-11-20 DIAGNOSIS — F329 Major depressive disorder, single episode, unspecified: Secondary | ICD-10-CM | POA: Diagnosis not present

## 2019-11-20 MED ORDER — TRAMADOL HCL 50 MG PO TABS: 50.0000 mg | ORAL_TABLET | Freq: Three times a day (TID) | ORAL | 0 refills | Status: AC | PRN

## 2019-11-20 NOTE — Patient Instructions (Signed)
It was good to see you again today, I am glad that your mood is at least somewhat better! We will get some lab work today and attempt to discover any source of your pain.  If everything is negative, you may have fibromyalgia as we discussed In this case, we can experiment with a medication such as gabapentin I gave you a few tramadol to use for more severe pains in the meantime-please use with caution, this is a mild narcotic and can be habit-forming. I do think okay to use 200 to 400 mg of ibuprofen on occasion

## 2019-11-21 ENCOUNTER — Other Ambulatory Visit (INDEPENDENT_AMBULATORY_CARE_PROVIDER_SITE_OTHER): Payer: Managed Care, Other (non HMO)

## 2019-11-21 DIAGNOSIS — R748 Abnormal levels of other serum enzymes: Secondary | ICD-10-CM

## 2019-11-21 DIAGNOSIS — R52 Pain, unspecified: Secondary | ICD-10-CM

## 2019-11-21 NOTE — Addendum Note (Signed)
Addended by: Rosita Kea on: 11/21/2019 02:47 PM   Modules accepted: Orders

## 2019-11-22 ENCOUNTER — Encounter: Payer: Self-pay | Admitting: Family Medicine

## 2019-11-22 MED ORDER — GABAPENTIN 100 MG PO CAPS: ORAL_CAPSULE | ORAL | 3 refills | Status: DC

## 2019-11-22 NOTE — Addendum Note (Signed)
Addended by: Abbe Amsterdam C on: 11/22/2019 07:04 PM   Modules accepted: Orders

## 2019-11-23 ENCOUNTER — Encounter: Payer: Managed Care, Other (non HMO) | Admitting: Family Medicine

## 2019-11-23 ENCOUNTER — Encounter: Payer: Self-pay | Admitting: Family Medicine

## 2019-11-27 LAB — HEPATIC FUNCTION PANEL
AG Ratio: 2 (calc) (ref 1.0–2.5)
ALT: 35 U/L — ABNORMAL HIGH (ref 6–29)
AST: 35 U/L (ref 10–35)
Albumin: 4.5 g/dL (ref 3.6–5.1)
Alkaline phosphatase (APISO): 122 U/L (ref 37–153)
Bilirubin, Direct: 0.1 mg/dL (ref 0.0–0.2)
Globulin: 2.3 g/dL (calc) (ref 1.9–3.7)
Indirect Bilirubin: 0.1 mg/dL (calc) — ABNORMAL LOW (ref 0.2–1.2)
Total Bilirubin: 0.2 mg/dL (ref 0.2–1.2)
Total Protein: 6.8 g/dL (ref 6.1–8.1)

## 2019-11-27 LAB — HEPATITIS PANEL, ACUTE
Hep A IgM: NONREACTIVE
Hep B C IgM: NONREACTIVE
Hepatitis B Surface Ag: NONREACTIVE
Hepatitis C Ab: NONREACTIVE
SIGNAL TO CUT-OFF: 0.01 (ref <1.00)

## 2019-11-27 LAB — CK: Total CK: 49 U/L (ref 29–143)

## 2019-11-27 LAB — CYCLIC CITRUL PEPTIDE ANTIBODY, IGG: Cyclic Citrullin Peptide Ab: <16 UNITS

## 2019-11-27 LAB — SEDIMENTATION RATE: Sed Rate: 17 mm/h (ref 0–30)

## 2019-11-27 LAB — RHEUMATOID FACTOR: Rheumatoid fact SerPl-aCnc: <14 IU/mL (ref <14)

## 2019-11-27 LAB — ANA: Anti Nuclear Antibody (ANA): NEGATIVE

## 2019-11-27 LAB — TEST AUTHORIZATION

## 2019-11-27 LAB — C-REACTIVE PROTEIN: CRP: 3.7 mg/L (ref <8.0)

## 2019-11-27 NOTE — Progress Notes (Deleted)
[facility] [address] [place], [place] [address] [phone]  Date:  11/29/2019   Name:  Lindsay Neal   DOB:  27-Aug-1965   MRN:  [id number]  PCP:  Pearline Cables, MD    Chief Complaint: No chief complaint on file.   History of Present Illness:  Lindsay Neal is a 54 y.o. very pleasant female patient who presents with the following:  Patient here today for physical exam-history of depression and anxiety, hypothyroidism, GERD, status post gastric bypass surgery Seen by myself recently with concern of body aches and mood changes-at that time she had concern of pains all over her body.  Lab work was unrevealing, she may be suffering from fibromyalgia  We had her start on gabapentin to see if it might be helpful  Pap Cologuard COVID-19 vaccine Mammogram April 2020 Flu shot done Shingrix done Recent lab work on chart Patient Active Problem List   Diagnosis Date Noted  . Right hip pain 03/05/2016  . Hypothyroidism due to acquired atrophy of thyroid 02/20/2016  . Status post bariatric surgery 02/20/2016  . 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 GERD (gastroesophageal reflux disease) 02/22/2013  . Dysmetabolic syndrome X 04/19/2012  . Allergic rhinitis 02/23/2012  . Depression 02/23/2012  . Sleep apnea, obstructive 02/23/2012    Past Medical History:  Diagnosis Date  . Arthritis   . Frequent headaches   . GERD (gastroesophageal reflux disease)   . High cholesterol   . HPV (human papilloma virus) infection   . Sleep apnea   . Urine incontinence     Past Surgical History:  Procedure Laterality Date  . CESAREAN SECTION  04/13/1991  . GALLBLADDER SURGERY  10/08/2014  . GASTRIC BYPASS  10/08/2014  . PLANTAR FASCIA SURGERY  2015  . SHOULDER SURGERY Right 2014  . VAGINA SURGERY      Social History   Tobacco Use  . Smoking status: Never Smoker  . Smokeless  tobacco: Never Used  Substance Use Topics  . Alcohol use: No  . Drug use: Not on file    Family History  Problem Relation Age of Onset  . Arthritis Mother   . Diabetes Mother   . Elevated Lipids Mother   . Elevated Lipids Father   . Heart disease Father   . Stroke Father   . Hypertension Father   . Heart disease Brother   . Colon cancer Maternal Grandmother   . Diabetes Maternal Grandmother   . Elevated Lipids Maternal Grandfather   . Arthritis Paternal Grandmother   . Hypertension Paternal Grandmother     Allergies  Allergen Reactions  . Penicillins   . Pollen Extract Other (See Comments)    Stuffy nose  . Sulfa Antibiotics     Medication list has been reviewed and updated.  Current Outpatient Medications on File Prior to Visit  Medication Sig Dispense Refill  . buPROPion (WELLBUTRIN SR) 150 MG 12 hr tablet Take 1 tablet (150 mg total) by mouth 2 (two) times daily. 60 tablet 3  . calcium citrate-vitamin D (CITRACAL+D) 315-200 MG-UNIT tablet Take 1 tablet by mouth daily.    Marland Kitchen esomeprazole (NEXIUM) 40 MG capsule Take 1 capsule (40 mg total) by mouth daily. Take 1 capsule by mouth once daily 90 capsule 3  . gabapentin (NEURONTIN) 100 MG capsule Take one at bedtime.  May gradually increase to 1 three times a day as needed  90 capsule 3  . hydrOXYzine (VISTARIL) 25 MG capsule Take 1 or 2 at bedtime as needed for insomnia 60 capsule 3  . IRON PO Take 65 mg by mouth daily.    Marland Kitchen levocetirizine (XYZAL) 5 MG tablet Take 1 tablet (5 mg total) by mouth every evening. 30 tablet 0  . levothyroxine (SYNTHROID) 75 MCG tablet Take 1 tablet (75 mcg total) by mouth daily before breakfast. 90 tablet 1  . simvastatin (ZOCOR) 10 MG tablet Take 1 tablet (10 mg total) by mouth at bedtime. 90 tablet 3   No current facility-administered medications on file prior to visit.    Review of Systems:  As per HPI- otherwise negative.   Physical Examination: There were no vitals filed for this  visit. There were no vitals filed for this visit. There is no height or weight on file to calculate BMI. Ideal Body Weight:    GEN: no acute distress. HEENT: Atraumatic, Normocephalic.  Ears and Nose: No external deformity. CV: RRR, No M/G/R. No JVD. No thrill. No extra heart sounds. PULM: CTA B, no wheezes, crackles, rhonchi. No retractions. No resp. distress. No accessory muscle use. ABD: S, NT, ND, +BS. No rebound. No HSM. EXTR: No c/c/e PSYCH: Normally interactive. Conversant.    Assessment and Plan: *** This visit occurred during the SARS-CoV-2 public health emergency.  Safety protocols were in place, including screening questions prior to the visit, additional usage of staff PPE, and extensive cleaning of exam room while observing appropriate contact time as indicated for disinfecting solutions.    Signed Abbe Amsterdam, MD

## 2019-11-28 ENCOUNTER — Encounter: Payer: Self-pay | Admitting: Family Medicine

## 2019-11-28 DIAGNOSIS — E785 Hyperlipidemia, unspecified: Secondary | ICD-10-CM

## 2019-11-28 MED ORDER — SIMVASTATIN 10 MG PO TABS: 10.0000 mg | ORAL_TABLET | Freq: Every day | ORAL | 1 refills | Status: DC

## 2019-11-29 ENCOUNTER — Encounter: Payer: Managed Care, Other (non HMO) | Admitting: Family Medicine

## 2019-11-29 ENCOUNTER — Other Ambulatory Visit: Payer: Self-pay | Admitting: Family Medicine

## 2019-11-29 DIAGNOSIS — E785 Hyperlipidemia, unspecified: Secondary | ICD-10-CM

## 2019-12-04 NOTE — Progress Notes (Signed)
[facility] [address], [address] [place], [place] [address] [phone]  Date:  12/07/2019   Name:  Lindsay Neal   DOB:  [birth year]   MRN:  [id number]  PCP:  Pearline Cables, MD    Chief Complaint: Annual Exam   History of Present Illness:  Lindsay Neal is a 54 y.o. very pleasant female patient who presents with the following:  Patient today for physical exam I have seen her on a couple of occasions recently with depression and anxiety, as well as generalized body pains  Most recent visit on November 1: Following up today with concern of generalized aches and pains, fatigue and muscle aches.  I suspect the symptoms may be related to increased stress recently with her brother's legal problems.  This may turn out to be fibromyalgia.  Will attempt to rule out any other cause with lab work as above For the time being defer x-rays, she is not interested in considering hip surgery and her hands are quite normal on exam Noticed mild transaminitis on last labs, recheck today She is doing well with Wellbutrin so far, will continue this for the time being Patient has been avoiding Tylenol due to her transaminitis.  She also has been told not to take NSAIDs in the past due to gastric bypass surgery.  Advised her that using a moderate dose of NSAID on occasion is likely okay.  If all of her labs are normal we may consider putting her on gabapentin.  For the time being, I did give her 15 tramadol to use as needed for more severe pain.  Pap smear- done 1-2 years ago per her GYN provider, normal per patient report. She declines a pap today Cologuard- she is not sure if her insurance will cover this as of yet.  She is working on finding this out.  If not covered she will let me know and we can order colonoscopy  COVID-19 vaccine done  Flu done  Mammogram done April 2020- pt reports this was actually done just this week, I have  not yet received the report Lab work up-to-date  She feels like the wellbutrin is helping her, and her mood is somewhat better  She is taking wellbutrin 150 BID and does feel that she is somewhat better She is not getting as anxious or having breakdowns. She does still feel tired and achy She does not love her job and continues to look for something new-her difficult boss has been away this week which is helped some  Lab Results  Component Value Date   TSH 1.72 10/12/2019   We had her start on gabapentin- she is taking 100 am and 100 pm; this is not making a huge difference, but she does feel like the achiness is somewhat better   She and her husband are working with a marriage counselor- she wondered about medication to improve her libido  She is thinking of getting back into exercise which I think is a great idea   She notes that she does not have much of an appetite but her weight is stable  History of gastric bypass  She may feel hot and cold  Wt Readings from Last 3 Encounters:  12/07/19 152 lb 3.2 oz (69 kg)  11/20/19 151 lb (68.5 kg)  10/12/19 153 lb (69.4 kg)     Patient Active Problem List   Diagnosis Date Noted  . Right hip  pain 03/05/2016  . Hypothyroidism due to acquired atrophy of thyroid 02/20/2016  . Status post bariatric surgery 02/20/2016  . 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 GERD (gastroesophageal reflux disease) 02/22/2013  . Dysmetabolic syndrome X 04/19/2012  . Allergic rhinitis 02/23/2012  . Depression 02/23/2012  . Sleep apnea, obstructive 02/23/2012    Past Medical History:  Diagnosis Date  . Arthritis   . Frequent headaches   . GERD (gastroesophageal reflux disease)   . High cholesterol   . HPV (human papilloma virus) infection   . Sleep apnea   . Urine incontinence     Past Surgical History:  Procedure Laterality Date  . CESAREAN SECTION  04/13/1991  . GALLBLADDER SURGERY  10/08/2014  . GASTRIC BYPASS   10/08/2014  . PLANTAR FASCIA SURGERY  2015  . SHOULDER SURGERY Right 2014  . VAGINA SURGERY      Social History   Tobacco Use  . Smoking status: Never Smoker  . Smokeless tobacco: Never Used  Substance Use Topics  . Alcohol use: No  . Drug use: Not on file    Family History  Problem Relation Age of Onset  . Arthritis Mother   . Diabetes Mother   . Elevated Lipids Mother   . Elevated Lipids Father   . Heart disease Father   . Stroke Father   . Hypertension Father   . Heart disease Brother   . Colon cancer Maternal Grandmother   . Diabetes Maternal Grandmother   . Elevated Lipids Maternal Grandfather   . Arthritis Paternal Grandmother   . Hypertension Paternal Grandmother     Allergies  Allergen Reactions  . Penicillins   . Pollen Extract Other (See Comments)    Stuffy nose  . Sulfa Antibiotics     Medication list has been reviewed and updated.  Current Outpatient Medications on File Prior to Visit  Medication Sig Dispense Refill  . buPROPion (WELLBUTRIN SR) 150 MG 12 hr tablet Take 1 tablet (150 mg total) by mouth 2 (two) times daily. 60 tablet 3  . calcium citrate-vitamin D (CITRACAL+D) 315-200 MG-UNIT tablet Take 1 tablet by mouth daily.    Marland Kitchen esomeprazole (NEXIUM) 40 MG capsule Take 1 capsule (40 mg total) by mouth daily. Take 1 capsule by mouth once daily 90 capsule 3  . gabapentin (NEURONTIN) 100 MG capsule Take one at bedtime.  May gradually increase to 1 three times a day as needed 90 capsule 3  . hydrOXYzine (VISTARIL) 25 MG capsule Take 1 or 2 at bedtime as needed for insomnia 60 capsule 3  . IRON PO Take 65 mg by mouth daily.    Marland Kitchen levocetirizine (XYZAL) 5 MG tablet Take 1 tablet (5 mg total) by mouth every evening. 30 tablet 0  . levothyroxine (SYNTHROID) 75 MCG tablet Take 1 tablet (75 mcg total) by mouth daily before breakfast. 90 tablet 1  . simvastatin (ZOCOR) 10 MG tablet TAKE 1 TABLET BY MOUTH AT BEDTIME 90 tablet 1   No current  facility-administered medications on file prior to visit.    Review of Systems:  As per HPI- otherwise negative.   Physical Examination: Vitals:   12/07/19 1532  BP: 102/60  Pulse: 82  Temp: 98.3 F (36.8 C)  SpO2: 97%   Vitals:   12/07/19 1532  Weight: 152 lb 3.2 oz (69 kg)  Height: 5\' 4"  (1.626 m)   Body mass index is 26.13 kg/m. Ideal Body Weight: Weight in (lb) to have BMI =  25: 145.3  GEN: no acute distress.  Minimal overweight, looks well.  Seems much calmer and in a better frame of mind today HEENT: Atraumatic, Normocephalic.  Ears and Nose: No external deformity. CV: RRR, No M/G/R. No JVD. No thrill. No extra heart sounds. PULM: CTA B, no wheezes, crackles, rhonchi. No retractions. No resp. distress. No accessory muscle use. ABD: S, NT, ND, +BS. No rebound. No HSM. EXTR: No c/c/e PSYCH: Normally interactive. Conversant.    Assessment and Plan: Physical exam  Reactive depression - Plan: buPROPion (WELLBUTRIN SR) 150 MG 12 hr tablet  Body aches  Chronic fatigue  Visit today for physical exam.  Thomasenia's most recent challenge has been significant anxiety and depression.  She is actually doing much better today, the Wellbutrin does seem to be helping her.  I encouraged her that she may continue to see increased benefit as weeks go by.  Encouraged her to get back and exercise and to follow a healthy diet  We talked about increasing dose of Wellbutrin, for now she would like to continue her current dosage.  She can increase her gabapentin to 100 a.m., 100 noon, 200 p.m. gradually if she would like She is seeing a counselor which is terrific  I encouraged her to keep up the good work.  Revisit in 4 months assuming she is doing well, sooner if any concerns This visit occurred during the SARS-CoV-2 public health emergency.  Safety protocols were in place, including screening questions prior to the visit, additional usage of staff PPE, and extensive cleaning of exam  room while observing appropriate contact time as indicated for disinfecting solutions.    Signed Abbe Amsterdam, MD

## 2019-12-07 ENCOUNTER — Encounter: Payer: Self-pay | Admitting: Family Medicine

## 2019-12-07 ENCOUNTER — Other Ambulatory Visit: Payer: Self-pay

## 2019-12-07 ENCOUNTER — Ambulatory Visit (INDEPENDENT_AMBULATORY_CARE_PROVIDER_SITE_OTHER): Payer: Managed Care, Other (non HMO) | Admitting: Family Medicine

## 2019-12-07 VITALS — BP 102/60 | HR 82 | Temp 98.3°F | Ht 64.0 in | Wt 152.2 lb

## 2019-12-07 DIAGNOSIS — Z0000 Encounter for general adult medical examination without abnormal findings: Secondary | ICD-10-CM | POA: Diagnosis not present

## 2019-12-07 DIAGNOSIS — R52 Pain, unspecified: Secondary | ICD-10-CM | POA: Diagnosis not present

## 2019-12-07 DIAGNOSIS — R5382 Chronic fatigue, unspecified: Secondary | ICD-10-CM

## 2019-12-07 DIAGNOSIS — R5383 Other fatigue: Secondary | ICD-10-CM

## 2019-12-07 DIAGNOSIS — F329 Major depressive disorder, single episode, unspecified: Secondary | ICD-10-CM | POA: Diagnosis not present

## 2019-12-07 MED ORDER — BUPROPION HCL ER (SR) 150 MG PO TB12: 150.0000 mg | ORAL_TABLET | Freq: Two times a day (BID) | ORAL | 3 refills | Status: DC

## 2019-12-07 NOTE — Patient Instructions (Signed)
Good to see you again- you look so much better!  Please continue the wellbutrin.  I am proud of all the work you have done We can increase your gabapentin to 100 mg am, 100 mg noon and 200 mg bedtime if you like- increase gradually.  Let me know if this helps you Your idea to exercise is excellent!  This will likely help with your mood and body pains  Please keep me posted about how you are doing.  Let's visit in 4 months to see how you are doing- sooner if you need me for any reason    Health Maintenance, Female Adopting a healthy lifestyle and getting preventive care are important in promoting health and wellness. Ask your health care provider about:  The right schedule for you to have regular tests and exams.  Things you can do on your own to prevent diseases and keep yourself healthy. What should I know about diet, weight, and exercise? Eat a healthy diet   Eat a diet that includes plenty of vegetables, fruits, low-fat dairy products, and lean protein.  Do not eat a lot of foods that are high in solid fats, added sugars, or sodium. Maintain a healthy weight Body mass index (BMI) is used to identify weight problems. It estimates body fat based on height and weight. Your health care provider can help determine your BMI and help you achieve or maintain a healthy weight. Get regular exercise Get regular exercise. This is one of the most important things you can do for your health. Most adults should:  Exercise for at least 150 minutes each week. The exercise should increase your heart rate and make you sweat (moderate-intensity exercise).  Do strengthening exercises at least twice a week. This is in addition to the moderate-intensity exercise.  Spend less time sitting. Even light physical activity can be beneficial. Watch cholesterol and blood lipids Have your blood tested for lipids and cholesterol at 53 years of age, then have this test every 5 years. Have your cholesterol levels  checked more often if:  Your lipid or cholesterol levels are high.  You are older than 53 years of age.  You are at high risk for heart disease. What should I know about cancer screening? Depending on your health history and family history, you may need to have cancer screening at various ages. This may include screening for:  Breast cancer.  Cervical cancer.  Colorectal cancer.  Skin cancer.  Lung cancer. What should I know about heart disease, diabetes, and high blood pressure? Blood pressure and heart disease  High blood pressure causes heart disease and increases the risk of stroke. This is more likely to develop in people who have high blood pressure readings, are of African descent, or are overweight.  Have your blood pressure checked: ? Every 3-5 years if you are 60-59 years of age. ? Every year if you are 70 years old or older. Diabetes Have regular diabetes screenings. This checks your fasting blood sugar level. Have the screening done:  Once every three years after age 36 if you are at a normal weight and have a low risk for diabetes.  More often and at a younger age if you are overweight or have a high risk for diabetes. What should I know about preventing infection? Hepatitis B If you have a higher risk for hepatitis B, you should be screened for this virus. Talk with your health care provider to find out if you are at risk for  hepatitis B infection. Hepatitis C Testing is recommended for:  Everyone born from [birth year] through 1964.  Anyone with known risk factors for hepatitis C. Sexually transmitted infections (STIs)  Get screened for STIs, including gonorrhea and chlamydia, if: ? You are sexually active and are younger than 53 years of age. ? You are older than 53 years of age and your health care provider tells you that you are at risk for this type of infection. ? Your sexual activity has changed since you were last screened, and you are at increased risk  for chlamydia or gonorrhea. Ask your health care provider if you are at risk.  Ask your health care provider about whether you are at high risk for HIV. Your health care provider may recommend a prescription medicine to help prevent HIV infection. If you choose to take medicine to prevent HIV, you should first get tested for HIV. You should then be tested every 3 months for as long as you are taking the medicine. Pregnancy  If you are about to stop having your period (premenopausal) and you may become pregnant, seek counseling before you get pregnant.  Take 400 to 800 micrograms (mcg) of folic acid every day if you become pregnant.  Ask for birth control (contraception) if you want to prevent pregnancy. Osteoporosis and menopause Osteoporosis is a disease in which the bones lose minerals and strength with aging. This can result in bone fractures. If you are 5 years old or older, or if you are at risk for osteoporosis and fractures, ask your health care provider if you should:  Be screened for bone loss.  Take a calcium or vitamin D supplement to lower your risk of fractures.  Be given hormone replacement therapy (HRT) to treat symptoms of menopause. Follow these instructions at home: Lifestyle  Do not use any products that contain nicotine or tobacco, such as cigarettes, e-cigarettes, and chewing tobacco. If you need help quitting, ask your health care provider.  Do not use street drugs.  Do not share needles.  Ask your health care provider for help if you need support or information about quitting drugs. Alcohol use  Do not drink alcohol if: ? Your health care provider tells you not to drink. ? You are pregnant, may be pregnant, or are planning to become pregnant.  If you drink alcohol: ? Limit how much you use to 0-1 drink a day. ? Limit intake if you are breastfeeding.  Be aware of how much alcohol is in your drink. In the [place] one drink equals one 12 oz bottle of beer (355  mL), one 5 oz glass of wine (148 mL), or one 1 oz glass of hard liquor (44 mL). General instructions  Schedule regular health, dental, and eye exams.  Stay current with your vaccines.  Tell your health care provider if: ? You often feel depressed. ? You have ever been abused or do not feel safe at home. Summary  Adopting a healthy lifestyle and getting preventive care are important in promoting health and wellness.  Follow your health care provider's instructions about healthy diet, exercising, and getting tested or screened for diseases.  Follow your health care provider's instructions on monitoring your cholesterol and blood pressure. This information is not intended to replace advice given to you by your health care provider. Make sure you discuss any questions you have with your health care provider. Document Revised: 12/29/2017 Document Reviewed: 12/29/2017 Elsevier Patient Education  2020 [employer].

## 2019-12-12 ENCOUNTER — Encounter: Payer: Self-pay | Admitting: Family Medicine

## 2019-12-12 DIAGNOSIS — Z1211 Encounter for screening for malignant neoplasm of colon: Secondary | ICD-10-CM

## 2019-12-12 NOTE — Telephone Encounter (Signed)
Cologuard kit ordered via [employer] portal.

## 2019-12-27 ENCOUNTER — Encounter: Payer: Self-pay | Admitting: Family Medicine

## 2019-12-27 DIAGNOSIS — R195 Other fecal abnormalities: Secondary | ICD-10-CM

## 2019-12-27 LAB — COLOGUARD: Cologuard: POSITIVE — AB

## 2019-12-28 ENCOUNTER — Encounter: Payer: Self-pay | Admitting: Family Medicine

## 2020-01-01 ENCOUNTER — Encounter: Payer: Self-pay | Admitting: Family Medicine

## 2020-01-01 DIAGNOSIS — M542 Cervicalgia: Secondary | ICD-10-CM

## 2020-01-02 ENCOUNTER — Encounter: Payer: Self-pay | Admitting: Gastroenterology

## 2020-01-02 ENCOUNTER — Ambulatory Visit (INDEPENDENT_AMBULATORY_CARE_PROVIDER_SITE_OTHER): Payer: Managed Care, Other (non HMO) | Admitting: Gastroenterology

## 2020-01-02 VITALS — HR 82 | Ht 64.0 in | Wt 158.0 lb

## 2020-01-02 DIAGNOSIS — R195 Other fecal abnormalities: Secondary | ICD-10-CM | POA: Diagnosis not present

## 2020-01-02 MED ORDER — NA SULFATE-K SULFATE-MG SULF 17.5-3.13-1.6 GM/177ML PO SOLN: 1.0000 | Freq: Once | ORAL | 0 refills | Status: AC

## 2020-01-02 MED ORDER — TRAMADOL HCL 50 MG PO TABS: ORAL_TABLET | ORAL | 0 refills | Status: DC

## 2020-01-02 NOTE — Addendum Note (Signed)
Addended by: Abbe Amsterdam C on: 01/02/2020 02:27 PM   Modules accepted: Orders

## 2020-01-02 NOTE — Progress Notes (Signed)
History of Present Illness: This is a 54 year old female referred by Copland, Gwenlyn Found, MD for the evaluation of a positive Cologuard.  She is accompanied by her husband.  She relates she had a Cologuard 3 years ago that was negative.  Both her mother and older brother have had colon polyps around age 47.  Her maternal grandmother was diagnosed with colon cancer at age 57.  She has no gastrointestinal complaints.  She relates that she may have had an EGD with [facility] prior to her gastric bypass in 2016 at [facility].  She has lost 80 pounds and maintained her weight loss.  Her reflux is controlled on daily Nexium.  Denies abdominal pain, constipation, diarrhea, change in stool caliber, melena, hematochezia, nausea, vomiting, dysphagia, chest pain.    Allergies  Allergen Reactions  . Penicillins   . Pollen Extract Other (See Comments)    Stuffy nose  . Sulfa Antibiotics    Outpatient Medications Prior to Visit  Medication Sig Dispense Refill  . buPROPion (WELLBUTRIN SR) 150 MG 12 hr tablet Take 1 tablet (150 mg total) by mouth 2 (two) times daily. 180 tablet 3  . calcium citrate-vitamin D (CITRACAL+D) 315-200 MG-UNIT tablet Take 1 tablet by mouth daily.    Marland Kitchen esomeprazole (NEXIUM) 40 MG capsule Take 1 capsule (40 mg total) by mouth daily. Take 1 capsule by mouth once daily 90 capsule 3  . gabapentin (NEURONTIN) 100 MG capsule Take one at bedtime.  May gradually increase to 1 three times a day as needed (Patient taking differently: Take 200 mg by mouth 2 (two) times daily. Take one at bedtime.  May gradually increase to 1 three times a day as needed) 90 capsule 3  . hydrOXYzine (VISTARIL) 25 MG capsule Take 1 or 2 at bedtime as needed for insomnia 60 capsule 3  . IRON PO Take 65 mg by mouth daily.    Marland Kitchen levocetirizine (XYZAL) 5 MG tablet Take 1 tablet (5 mg total) by mouth every evening. 30 tablet 0  . levothyroxine (SYNTHROID) 75 MCG tablet Take 1 tablet (75 mcg total) by mouth daily  before breakfast. 90 tablet 1  . simvastatin (ZOCOR) 10 MG tablet TAKE 1 TABLET BY MOUTH AT BEDTIME 90 tablet 1   No facility-administered medications prior to visit.   Past Medical History:  Diagnosis Date  . Arthritis   . Frequent headaches   . GERD (gastroesophageal reflux disease)   . High cholesterol   . HPV (human papilloma virus) infection   . Sleep apnea   . Urine incontinence    Past Surgical History:  Procedure Laterality Date  . CESAREAN SECTION  04/13/1991  . GALLBLADDER SURGERY  10/08/2014  . GASTRIC BYPASS  10/08/2014  . PLANTAR FASCIA SURGERY  2015  . SHOULDER SURGERY Right 2014  . VAGINA SURGERY     Social History   Socioeconomic History  . Marital status: Married    Spouse name: Not on file  . Number of children: Not on file  . Years of education: Not on file  . Highest education level: Not on file  Occupational History  . Not on file  Tobacco Use  . Smoking status: Never Smoker  . Smokeless tobacco: Never Used  Vaping Use  . Vaping Use: Never used  Substance and Sexual Activity  . Alcohol use: No  . Drug use: Never  . Sexual activity: Yes    Partners: Male  Other Topics Concern  . Not on  file  Social History Narrative  . Not on file   Social Determinants of Health   Financial Resource Strain: Not on file  Food Insecurity: Not on file  Transportation Needs: Not on file  Physical Activity: Not on file  Stress: Not on file  Social Connections: Not on file   Family History  Problem Relation Age of Onset  . Arthritis Mother   . Diabetes Mother   . Elevated Lipids Mother   . Elevated Lipids Father   . Heart disease Father   . Stroke Father   . Hypertension Father   . Heart disease Brother   . Colon cancer Maternal Grandmother   . Diabetes Maternal Grandmother   . Elevated Lipids Maternal Grandfather   . Arthritis Paternal Grandmother   . Hypertension Paternal Grandmother   . Stomach cancer Neg Hx   . Pancreatic cancer Neg Hx   .  Esophageal cancer Neg Hx       Review of Systems: Pertinent positive and negative review of systems were noted in the above HPI section. All other review of systems were otherwise negative.   Physical Exam: General: Well developed, well nourished, no acute distress Head: Normocephalic and atraumatic Eyes:  sclerae anicteric, EOMI Ears: Normal auditory acuity Mouth: Not examined, mask on during Covid-19 pandemic Neck: Supple, no masses or thyromegaly Lungs: Clear throughout to auscultation Heart: Regular rate and rhythm; no murmurs, rubs or bruits Abdomen: Soft, non tender and non distended. No masses, hepatosplenomegaly or hernias noted. Normal Bowel sounds Rectal: Deferred to colonoscopy Musculoskeletal: Symmetrical with no gross deformities  Skin: No lesions on visible extremities Pulses:  Normal pulses noted Extremities: No clubbing, cyanosis, edema or deformities noted Neurological: Alert oriented x 4, grossly nonfocal Cervical Nodes:  No significant cervical adenopathy Inguinal Nodes: No significant inguinal adenopathy Psychological:  Alert and cooperative. Normal mood and affect   Assessment and Recommendations:  1. Positive Cologuard.  Family history of colon polyps in her mother and older brother, both around age 56.  Family history of colon cancer in her maternal grandmother at age 82.  Rule out colorectal neoplasms.  Schedule colonoscopy. The risks (including bleeding, perforation, infection, missed lesions, medication reactions and possible hospitalization or surgery if complications occur), benefits, and alternatives to colonoscopy with possible biopsy and possible polypectomy were discussed with the patient and they consent to proceed.  Given her family history she was recommended to discontinue Cologuard CRC screening and have a colonoscopy at least every 5 years.  A shorter interval may be recommended based on the number and type of colon polyps found, if any.  2.   GERD.  Symptoms controlled on Nexium 40 mg daily.  Follow standard antireflux measures.  3.  Status post gastric bypass and cholecystectomy in 2016.   cc: Copland, Gwenlyn Found, MD [address] [place],  [place] [address]

## 2020-01-02 NOTE — Patient Instructions (Signed)
If you are age 53 or older, your body mass index should be between 23-30. Your Body mass index is 27.12 kg/m. If this is out of the aforementioned range listed, please consider follow up with your Primary Care Provider.  If you are age 57 or younger, your body mass index should be between 19-25. Your Body mass index is 27.12 kg/m. If this is out of the aformentioned range listed, please consider follow up with your Primary Care Provider.    You have been scheduled for a colonoscopy. Please follow written instructions given to you at your visit today.  Please pick up your prep supplies at the pharmacy within the next 1-3 days. If you use inhalers (even only as needed), please bring them with you on the day of your procedure.   It was great seeing you today!  Thank you for entrusting me with your care and choosing [facility].  Dr. Russella Dar

## 2020-01-03 ENCOUNTER — Ambulatory Visit (HOSPITAL_BASED_OUTPATIENT_CLINIC_OR_DEPARTMENT_OTHER)
Admission: RE | Admit: 2020-01-03 | Discharge: 2020-01-03 | Disposition: A | Discharge status: Home / Self Care | Payer: Managed Care, Other (non HMO) | Source: Ambulatory Visit | Attending: Family Medicine | Admitting: Family Medicine

## 2020-01-03 ENCOUNTER — Other Ambulatory Visit: Payer: Self-pay

## 2020-01-03 DIAGNOSIS — M542 Cervicalgia: Secondary | ICD-10-CM | POA: Insufficient documentation

## 2020-01-04 ENCOUNTER — Ambulatory Visit (AMBULATORY_SURGERY_CENTER): Payer: Managed Care, Other (non HMO) | Admitting: Gastroenterology

## 2020-01-04 ENCOUNTER — Other Ambulatory Visit: Payer: Self-pay

## 2020-01-04 ENCOUNTER — Encounter: Payer: Self-pay | Admitting: Gastroenterology

## 2020-01-04 ENCOUNTER — Encounter: Payer: Self-pay | Admitting: Family Medicine

## 2020-01-04 VITALS — BP 100/61 | HR 66 | Temp 97.1°F | Resp 20 | Ht 64.0 in | Wt 158.0 lb

## 2020-01-04 DIAGNOSIS — Z8371 Family history of colonic polyps: Secondary | ICD-10-CM

## 2020-01-04 DIAGNOSIS — R195 Other fecal abnormalities: Secondary | ICD-10-CM

## 2020-01-04 DIAGNOSIS — K5730 Diverticulosis of large intestine without perforation or abscess without bleeding: Secondary | ICD-10-CM

## 2020-01-04 MED ORDER — METHOCARBAMOL 500 MG PO TABS: 500.0000 mg | ORAL_TABLET | Freq: Three times a day (TID) | ORAL | 1 refills | Status: DC | PRN

## 2020-01-04 MED ORDER — SODIUM CHLORIDE 0.9 % IV SOLN: 500.0000 mL | INTRAVENOUS | Status: DC

## 2020-01-04 NOTE — Op Note (Signed)
[facility] Patient Name: Lindsay Neal Procedure Date: 01/04/2020 4:00 PM MRN: [id number] Endoscopist: Meryl Dare , MD Age: 54 Referring MD:  Date of Birth: 16-Sep-1965 Gender: Female Account #: [id number] Procedure:                Colonoscopy Indications:              Positive Cologuard test. Family history of colon                            polyps in 2 first degree relatives under age 20.                            Family history of colon cancer, 2nd degree relatice                            under age 77. Medicines:                Monitored Anesthesia Care Procedure:                Pre-Anesthesia Assessment:                           - Prior to the procedure, a History and Physical                            was performed, and patient medications and                            allergies were reviewed. The patient's tolerance of                            previous anesthesia was also reviewed. The risks                            and benefits of the procedure and the sedation                            options and risks were discussed with the patient.                            All questions were answered, and informed consent                            was obtained. Prior Anticoagulants: The patient has                            taken no previous anticoagulant or antiplatelet                            agents. ASA Grade Assessment: II - A patient with                            mild systemic disease. After reviewing the risks  and benefits, the patient was deemed in                            satisfactory condition to undergo the procedure.                           After obtaining informed consent, the colonoscope                            was passed under direct vision. Throughout the                            procedure, the patient's blood pressure, pulse, and                            oxygen saturations were monitored  continuously. The                            Colonoscope was introduced through the anus and                            advanced to the the cecum, identified by                            appendiceal orifice and ileocecal valve. The                            ileocecal valve, appendiceal orifice, and rectum                            were photographed. The quality of the bowel                            preparation was good after extensive lavage and                            suction. The colonoscopy was performed without                            difficulty. The patient tolerated the procedure                            well. Scope In: 4:05:33 PM Scope Out: 4:23:05 PM Scope Withdrawal Time: 0 hours 15 minutes 3 seconds  Total Procedure Duration: 0 hours 17 minutes 32 seconds  Findings:                 The perianal and digital rectal examinations were                            normal.                           Scattered medium-mouthed diverticula were found in  the entire colon. There was no evidence of                            diverticular bleeding.                           The exam was otherwise without abnormality on                            direct and retroflexion views. Complications:            No immediate complications. Estimated blood loss:                            None. Estimated Blood Loss:     Estimated blood loss: none. Impression:               - Mild scattered diverticulosis in the entire                            examined colon.                           - The examination was otherwise normal on direct                            and retroflexion views.                           - No specimens collected. Recommendation:           - Repeat colonoscopy in 5 years for screening                            purposes with a more extensive bowel prep.                           - Patient has a contact number available for                             emergencies. The signs and symptoms of potential                            delayed complications were discussed with the                            patient. Return to normal activities tomorrow.                            Written discharge instructions were provided to the                            patient.                           - Resume previous diet.                           -  Continue present medications. Meryl DareMalcolm T Cerrone Debold, MD 01/04/2020 4:27:16 PM This report has been signed electronically.

## 2020-01-04 NOTE — Patient Instructions (Signed)
Handout on diverticulosis given.   YOU HAD AN ENDOSCOPIC PROCEDURE TODAY AT THE [facility]:   Refer to the procedure report that was given to you for any specific questions about what was found during the examination.  If the procedure report does not answer your questions, please call your gastroenterologist to clarify.  If you requested that your care partner not be given the details of your procedure findings, then the procedure report has been included in a sealed envelope for you to review at your convenience later.  YOU SHOULD EXPECT: Some feelings of bloating in the abdomen. Passage of more gas than usual.  Walking can help get rid of the air that was put into your GI tract during the procedure and reduce the bloating. If you had a lower endoscopy (such as a colonoscopy or flexible sigmoidoscopy) you may notice spotting of blood in your stool or on the toilet paper. If you underwent a bowel prep for your procedure, you may not have a normal bowel movement for a few days.  Please Note:  You might notice some irritation and congestion in your nose or some drainage.  This is from the oxygen used during your procedure.  There is no need for concern and it should clear up in a day or so.  SYMPTOMS TO REPORT IMMEDIATELY:   Following lower endoscopy (colonoscopy or flexible sigmoidoscopy):  Excessive amounts of blood in the stool  Significant tenderness or worsening of abdominal pains  Swelling of the abdomen that is new, acute  Fever of 100F or higher   For urgent or emergent issues, a gastroenterologist can be reached at any hour by calling [phone]. Do not use MyChart messaging for urgent concerns.    DIET:  We do recommend a small meal at first, but then you may proceed to your regular diet.  Drink plenty of fluids but you should avoid alcoholic beverages for 24 hours.  ACTIVITY:  You should plan to take it easy for the rest of today and you should NOT DRIVE or use  heavy machinery until tomorrow (because of the sedation medicines used during the test).    FOLLOW UP: Our staff will call the number listed on your records 48-72 hours following your procedure to check on you and address any questions or concerns that you may have regarding the information given to you following your procedure. If we do not reach you, we will leave a message.  We will attempt to reach you two times.  During this call, we will ask if you have developed any symptoms of COVID 19. If you develop any symptoms (ie: fever, flu-like symptoms, shortness of breath, cough etc.) before then, please call [phone].  If you test positive for Covid 19 in the 2 weeks post procedure, please call and report this information to us.    If any biopsies were taken you will be contacted by phone or by letter within the next 1-3 weeks.  Please call us at [phone] if you have not heard about the biopsies in 3 weeks.    SIGNATURES/CONFIDENTIALITY: You and/or your care partner have signed paperwork which will be entered into your electronic medical record.  These signatures attest to the fact that that the information above on your After Visit Summary has been reviewed and is understood.  Full responsibility of the confidentiality of this discharge information lies with you and/or your care-partner. 

## 2020-01-04 NOTE — Progress Notes (Signed)
To pacu, Vss. Report to [email or web address] 

## 2020-01-08 ENCOUNTER — Encounter: Payer: Self-pay | Admitting: Family Medicine

## 2020-01-08 ENCOUNTER — Telehealth: Payer: Self-pay

## 2020-01-08 ENCOUNTER — Other Ambulatory Visit: Payer: Self-pay

## 2020-01-08 ENCOUNTER — Ambulatory Visit (INDEPENDENT_AMBULATORY_CARE_PROVIDER_SITE_OTHER): Payer: Managed Care, Other (non HMO) | Admitting: Family Medicine

## 2020-01-08 DIAGNOSIS — M25512 Pain in left shoulder: Secondary | ICD-10-CM

## 2020-01-08 DIAGNOSIS — G8929 Other chronic pain: Secondary | ICD-10-CM | POA: Diagnosis not present

## 2020-01-08 DIAGNOSIS — M25511 Pain in right shoulder: Secondary | ICD-10-CM

## 2020-01-08 DIAGNOSIS — M542 Cervicalgia: Secondary | ICD-10-CM | POA: Diagnosis not present

## 2020-01-08 MED ORDER — BACLOFEN 10 MG PO TABS: 5.0000 mg | ORAL_TABLET | Freq: Three times a day (TID) | ORAL | 3 refills | Status: DC | PRN

## 2020-01-08 MED ORDER — ETODOLAC 400 MG PO TABS: 400.0000 mg | ORAL_TABLET | Freq: Two times a day (BID) | ORAL | 3 refills | Status: DC | PRN

## 2020-01-08 NOTE — Progress Notes (Signed)
Office Visit Note   Patient: Lindsay Neal           Date of Birth: Feb 05, 1965           MRN: [id number] Visit Date: 01/08/2020 Requested by: Pearline Cables, MD [address] [place],  [place] [address] PCP: Pearline Cables, MD  Subjective: Chief Complaint  Patient presents with  . Neck - Pain    Pain in the neck and down to both upper arms - since September. Headaches and occasional jaw pain.   Also having pain bilateral groin region and bilateral knees.    HPI: Patient presents for neck pain which radiates down her shoulders.  This has been going on since September she.  She reports that it is now moving down into her hips.  The only change she can think of is that she did receive the Covid vaccine and the middle of September and received a second dose on September 23.  She reports she had a small head cold after and has been sore ever since.  Reports that she has also noticed intermittent dizziness and weakness in her upper extremities.  She was trying to pick up her granddaughter and noticed letter up in the loss of strength in her upper extremities.  Denies any difficulty ambulating.  Denies any loss of bowel or bladder.              ROS:  All other systems were reviewed and are negative.  Objective: Vital Signs: LMP 12/16/2015   Physical Exam:  General:  Alert and oriented, in no acute distress. Pulm:  Breathing unlabored. Psy:  Normal mood, congruent affect. Skin: No rashes appreciated MSK: Paraspinal tenderness to palpation in lower cervical and upper thoracic vertebrae.  Patient with positive pain with empty can test on bilateral upper extremities.  Patient also has pulsating twitching in upper extremities bilaterally when internally or externally rotating.  No weakness appreciated.  Sensation is intact. Neuro: Cranial nerves intact, no nystagmus noted, sensation equal in upper extremities bilaterally.  Imaging: No results found.  Assessment &  Plan: Neck and upper extremity pain: Patient with 17-month history of neck and upper extremity pain.  Reports weakness as well.  Unclear etiology but this may be related to COVID-19 vaccine.  Patient with little relief from Robaxin, Flexeril, tramadol.  Physical exam is reassuring although she does have muscle spasms and tightness lower cervical and upper thoracic spine.  Full range of motion. -Prescribing Lodine 4 pain -Referral placed for physical therapy -Follow-up in 4 to 6 weeks if symptoms do not resolve.   Procedures: No procedures performed        PMFS History: Patient Active Problem List   Diagnosis Date Noted  . Right hip pain 03/05/2016  . Hypothyroidism due to acquired atrophy of thyroid 02/20/2016  . Status post bariatric surgery 02/20/2016  . 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 GERD (gastroesophageal reflux disease) 02/22/2013  . Dysmetabolic syndrome X 04/19/2012  . Allergic rhinitis 02/23/2012  . Depression 02/23/2012  . Sleep apnea, obstructive 02/23/2012   Past Medical History:  Diagnosis Date  . Arthritis   . Frequent headaches   . GERD (gastroesophageal reflux disease)   . High cholesterol   . HPV (human papilloma virus) infection   . Sleep apnea   . Urine incontinence     Family History  Problem Relation Age of Onset  . Arthritis Mother   . Diabetes Mother   .  Elevated Lipids Mother   . Elevated Lipids Father   . Heart disease Father   . Stroke Father   . Hypertension Father   . Heart disease Brother   . Colon cancer Maternal Grandmother   . Diabetes Maternal Grandmother   . Elevated Lipids Maternal Grandfather   . Arthritis Paternal Grandmother   . Hypertension Paternal Grandmother   . Stomach cancer Neg Hx   . Pancreatic cancer Neg Hx   . Esophageal cancer Neg Hx     Past Surgical History:  Procedure Laterality Date  . CESAREAN SECTION  04/13/1991  . GALLBLADDER SURGERY  10/08/2014  . GASTRIC BYPASS   10/08/2014  . PLANTAR FASCIA SURGERY  2015  . SHOULDER SURGERY Right 2014  . VAGINA SURGERY     Social History   Occupational History  . Not on file  Tobacco Use  . Smoking status: Never Smoker  . Smokeless tobacco: Never Used  Vaping Use  . Vaping Use: Never used  Substance and Sexual Activity  . Alcohol use: No  . Drug use: Never  . Sexual activity: Yes    Partners: Male

## 2020-01-08 NOTE — Telephone Encounter (Signed)
  Follow up Call-  Call back number 01/04/2020  Post procedure Call Back phone  # [phone]  Permission to leave phone message Yes  Some recent data might be hidden     Patient questions:  Do you have a fever, pain , or abdominal swelling? No. Pain Score  0 *  Have you tolerated food without any problems? Yes.    Have you been able to return to your normal activities? Yes.    Do you have any questions about your discharge instructions: Diet   No. Medications  No. Follow up visit  No.  Do you have questions or concerns about your Care? No.  Actions: * If pain score is 4 or above: No action needed, pain <4.  1. Have you developed a fever since your procedure? no  2.   Have you had an respiratory symptoms (SOB or cough) since your procedure? no  3.   Have you tested positive for COVID 19 since your procedure no  4.   Have you had any family members/close contacts diagnosed with the COVID 19 since your procedure?  no   If yes to any of these questions please route to Laverna Peace, RN and Karlton Lemon, RN

## 2020-01-08 NOTE — Telephone Encounter (Signed)
No answer on follow up call. 

## 2020-01-11 ENCOUNTER — Telehealth: Payer: Self-pay | Admitting: *Deleted

## 2020-01-11 NOTE — Telephone Encounter (Signed)
Received fax from [facility] stating pt is scheduled for Physical therapy.

## 2020-01-29 ENCOUNTER — Encounter: Payer: Self-pay | Admitting: Family Medicine

## 2020-01-29 DIAGNOSIS — F329 Major depressive disorder, single episode, unspecified: Secondary | ICD-10-CM

## 2020-01-29 MED ORDER — BUSPIRONE HCL 7.5 MG PO TABS
7.5000 mg | ORAL_TABLET | Freq: Two times a day (BID) | ORAL | 3 refills | Status: DC
Start: 2020-01-29 — End: 2020-03-28

## 2020-01-29 NOTE — Telephone Encounter (Signed)
The patient contacted me and asked me to call her husband Lindsay Neal about her current symptoms She is taking Wellbutrin SR 150 twice a day right now, also gabapentin  Recent thyroid check normal  Lab Results  Component Value Date   TSH 1.72 10/12/2019   I saw her most recently in November-that time she felt like Wellbutrin was helping her, she felt less anxious and was not having "breakdowns" I had her starting gabapentin recently for generalized body aches and pains  I called John 4:15- no answer, VM is full Called back at 5:15 Lindsay Neal is concerned that his wife is more anxious, easily upset, tearful and easily overwhelmed as of late.  They recently got a new puppy, this is something Lindsay Neal has wanted for long time-however now that the puppy is home, he seems to be overwhelmed She has tried lexapro and also effexor in the past -currently using Wellbutrin.  She is making some progress with her back through chiropractic care   No SI We decided to continue wellbutrin and try adding buspar 7.5 BID I also recommended that they seek a psychiatrist for her- explained that while I am experienced in managing depression and anxiety, as we have not been able to get her sx in remission I would like to have them consult psychiatry   They agree to start working on this and will update me in a couple of days  16 minute conversation

## 2020-01-29 NOTE — Telephone Encounter (Signed)
Patient husband would like for you to call him instead of the patient.  Lindsay Neal [phone]

## 2020-02-19 ENCOUNTER — Other Ambulatory Visit: Payer: Self-pay | Admitting: Family Medicine

## 2020-02-19 DIAGNOSIS — F329 Major depressive disorder, single episode, unspecified: Secondary | ICD-10-CM

## 2020-03-11 ENCOUNTER — Encounter: Payer: Self-pay | Admitting: Family Medicine

## 2020-03-11 DIAGNOSIS — E162 Hypoglycemia, unspecified: Secondary | ICD-10-CM

## 2020-03-14 ENCOUNTER — Ambulatory Visit (INDEPENDENT_AMBULATORY_CARE_PROVIDER_SITE_OTHER): Payer: Managed Care, Other (non HMO) | Admitting: Family Medicine

## 2020-03-14 ENCOUNTER — Encounter: Payer: Self-pay | Admitting: Family Medicine

## 2020-03-14 ENCOUNTER — Other Ambulatory Visit: Payer: Self-pay

## 2020-03-14 VITALS — BP 122/80 | HR 88 | Temp 97.5°F | Resp 18 | Ht 64.0 in | Wt 160.0 lb

## 2020-03-14 DIAGNOSIS — R1012 Left upper quadrant pain: Secondary | ICD-10-CM

## 2020-03-14 DIAGNOSIS — E162 Hypoglycemia, unspecified: Secondary | ICD-10-CM

## 2020-03-14 DIAGNOSIS — R42 Dizziness and giddiness: Secondary | ICD-10-CM | POA: Diagnosis not present

## 2020-03-14 DIAGNOSIS — E034 Atrophy of thyroid (acquired): Secondary | ICD-10-CM

## 2020-03-14 LAB — GLUCOSE, POCT (MANUAL RESULT ENTRY): POC Glucose: 83 mg/dl (ref 70–99)

## 2020-03-14 MED ORDER — BLOOD GLUCOSE MONITOR KIT: PACK | 0 refills | Status: DC

## 2020-03-14 NOTE — Progress Notes (Signed)
[facility] [address], [address] [place], [place] [address] [phone]  Date:  03/14/2020   Name:  Lindsay Neal   DOB:  01-May-1965   MRN:  [id number]  PCP:  Pearline Cables, MD    Chief Complaint: Hypoglycemia (Dizziness, sweating, fatigue, shakiness, palpitations/Depression, nausea, 29-31 reading this week.)   History of Present Illness:  Lindsay Neal is a 55 y.o. very pleasant female patient who presents with the following:  Patient seen today for follow-up She is status post gastric bypass Recently she has been having a lot of difficulty with apparent anxiety and generalized body pains-however, they recently checked her blood sugar during one of her episodes and it was quite low, we wonder if she may actually have hypoglycemia  I last saw her myself in November At that time she was doing fairly well on Wellbutrin and was taking gabapentin However, the patient and her husband then contacted me with concern that she was getting worse At that time we will continue Wellbutrin and added BuSpar, I recommended consultation with psychiatry  Since that time they reached out to me due to having checked her blood sugar during an episode and noting quite low readings Can obtain labs to start w/u for hypoglycemia today  ?Glucose ?Insulin ?C-peptide ?Beta-hydroxybutyrate (BHOB) ?Proinsulin ?Sulfonylurea and meglitinide screen  She had gastric bypass 09/2014 Looking back she thinks she may have been having these sx of hypoglycemia for about 2 years Her weight is stable in the 153- 158lb range   She brings in notes regarding her glucose readings over the last few days She has recorded glucose down to 29 on her husband's meter. She does seem to respond to food Her husband has been checking his glucose as well and it seems like the meter is accurate    Patient Active Problem List   Diagnosis Date Noted  . Right hip  pain 03/05/2016  . Hypothyroidism due to acquired atrophy of thyroid 02/20/2016  . Status post bariatric surgery 02/20/2016  . 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 GERD (gastroesophageal reflux disease) 02/22/2013  . Dysmetabolic syndrome X 04/19/2012  . Allergic rhinitis 02/23/2012  . Depression 02/23/2012  . Sleep apnea, obstructive 02/23/2012    Past Medical History:  Diagnosis Date  . Arthritis   . Frequent headaches   . GERD (gastroesophageal reflux disease)   . High cholesterol   . HPV (human papilloma virus) infection   . Sleep apnea   . Urine incontinence     Past Surgical History:  Procedure Laterality Date  . CESAREAN SECTION  04/13/1991  . GALLBLADDER SURGERY  10/08/2014  . GASTRIC BYPASS  10/08/2014  . PLANTAR FASCIA SURGERY  2015  . SHOULDER SURGERY Right 2014  . VAGINA SURGERY      Social History   Tobacco Use  . Smoking status: Never Smoker  . Smokeless tobacco: Never Used  Vaping Use  . Vaping Use: Never used  Substance Use Topics  . Alcohol use: No  . Drug use: Never    Family History  Problem Relation Age of Onset  . Arthritis Mother   . Diabetes Mother   . Elevated Lipids Mother   . Elevated Lipids Father   . Heart disease Father   . Stroke Father   . Hypertension Father   . Heart disease Brother   . Colon cancer Maternal Grandmother   . Diabetes Maternal Grandmother   .  Elevated Lipids Maternal Grandfather   . Arthritis Paternal Grandmother   . Hypertension Paternal Grandmother   . Stomach cancer Neg Hx   . Pancreatic cancer Neg Hx   . Esophageal cancer Neg Hx     Allergies  Allergen Reactions  . Penicillins   . Pollen Extract Other (See Comments)    Stuffy nose  . Sulfa Antibiotics     Medication list has been reviewed and updated.  Current Outpatient Medications on File Prior to Visit  Medication Sig Dispense Refill  . baclofen (LIORESAL) 10 MG tablet Take 0.5-1 tablets (5-10 mg total) by  mouth 3 (three) times daily as needed for muscle spasms. 30 each 3  . buPROPion (WELLBUTRIN SR) 150 MG 12 hr tablet Take 1 tablet (150 mg total) by mouth 2 (two) times daily. 60 tablet 0  . busPIRone (BUSPAR) 7.5 MG tablet Take 1 tablet (7.5 mg total) by mouth 2 (two) times daily. 60 tablet 3  . calcium citrate-vitamin D (CITRACAL+D) 315-200 MG-UNIT tablet Take 1 tablet by mouth daily.    Marland Kitchen esomeprazole (NEXIUM) 40 MG capsule Take 1 capsule (40 mg total) by mouth daily. Take 1 capsule by mouth once daily 90 capsule 3  . etodolac (LODINE) 400 MG tablet Take 1 tablet (400 mg total) by mouth 2 (two) times daily as needed. 60 tablet 3  . IRON PO Take 65 mg by mouth daily.    Marland Kitchen levocetirizine (XYZAL) 5 MG tablet Take 1 tablet (5 mg total) by mouth every evening. 30 tablet 0  . levothyroxine (SYNTHROID) 75 MCG tablet Take 1 tablet (75 mcg total) by mouth daily before breakfast. 90 tablet 1  . methocarbamol (ROBAXIN) 500 MG tablet Take 1 tablet (500 mg total) by mouth every 8 (eight) hours as needed for muscle spasms. 30 tablet 1  . simvastatin (ZOCOR) 10 MG tablet TAKE 1 TABLET BY MOUTH AT BEDTIME 90 tablet 1   No current facility-administered medications on file prior to visit.    Review of Systems:  As per HPI- otherwise negative.   Physical Examination: Vitals:   03/14/20 1541  BP: 122/80  Pulse: 88  Resp: 18  Temp: (!) 97.5 F (36.4 C)  SpO2: 99%   Vitals:   03/14/20 1541  Weight: 160 lb (72.6 kg)  Height: 5\' 4"  (1.626 m)   Body mass index is 27.46 kg/m. Ideal Body Weight: Weight in (lb) to have BMI = 25: 145.3  GEN: no acute distress.  Normal weight, appears well  HEENT: Atraumatic, Normocephalic.  Ears and Nose: No external deformity. CV: RRR, No M/G/R. No JVD. No thrill. No extra heart sounds. PULM: CTA B, no wheezes, crackles, rhonchi. No retractions. No resp. distress. No accessory muscle use. ABD: S, NT, ND, +BS. No rebound. No HSM. EXTR: No c/c/e PSYCH: Normally  interactive. Conversant.   Pt glucose meter 51- our meter 83  Results for orders placed or performed in visit on 03/14/20  POCT glucose (manual entry)  Result Value Ref Range   POC Glucose 83 70 - 99 mg/dl    Assessment and Plan: Hypoglycemia - Plan: POCT glucose (manual entry), Insulin and C-Peptide, Beta-hydroxybutyric acid, Proinsulin, Sulfonylurea Hypoglycemics Panel, blood, Comprehensive metabolic panel, POCT Glucose (Device for Home Use)  Left upper quadrant pain - Plan: Amylase, Lipase  Dizziness - Plan: POCT Glucose (Device for Home Use)  Hypothyroidism due to acquired atrophy of thyroid - Plan: TSH  Pt here today with possible hypoglycemia.  We discussed the fact that true  hypoglycemia is rate- might be more likely after gastric bypass?  Her glucose meter also may not be accurate Will obtain labs as above, and asked her to get a new meter She will continue to take notes about her diet, symptoms and glucose readings.  I encouraged her to eat every 2-3 hours and to consume fats and protein as well as carbs with each meal/ snack Will plan further follow- up pending labs.  This visit occurred during the SARS-CoV-2 public health emergency.  Safety protocols were in place, including screening questions prior to the visit, additional usage of staff PPE, and extensive cleaning of exam room while observing appropriate contact time as indicated for disinfecting solutions.    Signed Abbe Amsterdam, MD

## 2020-03-14 NOTE — Patient Instructions (Addendum)
It was good to see you again today! We are going to further investigate possible low blood sugars for you I will be in touch with your labs asap We will plan to have you see endocrinology I would like you to get a new glucose meter as well to make sure we are getting accurate information

## 2020-03-15 LAB — COMPREHENSIVE METABOLIC PANEL
ALT: 41 U/L — ABNORMAL HIGH (ref 0–35)
AST: 36 U/L (ref 0–37)
Albumin: 4.3 g/dL (ref 3.5–5.2)
Alkaline Phosphatase: 107 U/L (ref 39–117)
BUN: 20 mg/dL (ref 6–23)
CO2: 28 mEq/L (ref 19–32)
Calcium: 9.5 mg/dL (ref 8.4–10.5)
Chloride: 104 mEq/L (ref 96–112)
Creatinine, Ser: 0.73 mg/dL (ref 0.40–1.20)
GFR: 93.58 mL/min (ref >60.00)
Glucose, Bld: 85 mg/dL (ref 70–99)
Potassium: 4.2 mEq/L (ref 3.5–5.1)
Sodium: 141 mEq/L (ref 135–145)
Total Bilirubin: 0.3 mg/dL (ref 0.2–1.2)
Total Protein: 7.3 g/dL (ref 6.0–8.3)

## 2020-03-15 LAB — TSH: TSH: 4.12 u[IU]/mL (ref 0.35–4.50)

## 2020-03-15 LAB — LIPASE: Lipase: 49 U/L (ref 11.0–59.0)

## 2020-03-15 LAB — INSULIN AND C-PEPTIDE, SERUM
C-Peptide: 2.2 ng/mL (ref 1.1–4.4)
INSULIN: 5.3 u[IU]/mL (ref 2.6–24.9)

## 2020-03-15 LAB — AMYLASE: Amylase: 61 U/L (ref 27–131)

## 2020-03-19 ENCOUNTER — Telehealth: Payer: Self-pay | Admitting: Family Medicine

## 2020-03-19 ENCOUNTER — Encounter: Payer: Self-pay | Admitting: Family Medicine

## 2020-03-19 NOTE — Telephone Encounter (Signed)
Could you advise on lab results?I don't see a mychart message or letter with results. Ill be happy to call the patient.

## 2020-03-19 NOTE — Telephone Encounter (Signed)
Left message on patients phone. She can log on to mychart or if she would like she can call me and ill go over with patient.

## 2020-03-19 NOTE — Telephone Encounter (Signed)
Patient is calling in reference to lab results.Pt would like a call back

## 2020-03-20 ENCOUNTER — Encounter: Payer: Self-pay | Admitting: Family Medicine

## 2020-03-21 ENCOUNTER — Other Ambulatory Visit: Payer: Self-pay | Admitting: Family Medicine

## 2020-03-21 DIAGNOSIS — F329 Major depressive disorder, single episode, unspecified: Secondary | ICD-10-CM

## 2020-03-25 ENCOUNTER — Telehealth: Payer: Self-pay | Admitting: *Deleted

## 2020-03-25 NOTE — Telephone Encounter (Signed)
Called [facility] to get turnaround time for the hypoglycemic panel and ProInsulin level.  The hypoglycemic panel is expected to report out on 3/11.  The Proinsulin level was sent to lab in [place] for testing and is only done 2 days a week.  Looks like it should be final "any day now", hopefully in the next couple of days.

## 2020-03-25 NOTE — Telephone Encounter (Signed)
Sent patient message regarding labs via mychart.

## 2020-03-26 ENCOUNTER — Encounter: Payer: Self-pay | Admitting: Family Medicine

## 2020-03-26 MED ORDER — HYDROXYZINE PAMOATE 25 MG PO CAPS: 25.0000 mg | ORAL_CAPSULE | Freq: Every evening | ORAL | 2 refills | Status: DC | PRN

## 2020-03-27 ENCOUNTER — Encounter: Payer: Self-pay | Admitting: Family Medicine

## 2020-03-27 DIAGNOSIS — F329 Major depressive disorder, single episode, unspecified: Secondary | ICD-10-CM

## 2020-03-28 MED ORDER — BUSPIRONE HCL 15 MG PO TABS: 15.0000 mg | ORAL_TABLET | Freq: Two times a day (BID) | ORAL | 3 refills | Status: DC

## 2020-03-28 NOTE — Addendum Note (Signed)
Addended by: Pearline Cables on: 03/28/2020 06:54 PM   Modules accepted: Orders

## 2020-04-04 ENCOUNTER — Ambulatory Visit (INDEPENDENT_AMBULATORY_CARE_PROVIDER_SITE_OTHER): Payer: Managed Care, Other (non HMO) | Admitting: Internal Medicine

## 2020-04-04 ENCOUNTER — Encounter: Payer: Self-pay | Admitting: Internal Medicine

## 2020-04-04 ENCOUNTER — Other Ambulatory Visit: Payer: Self-pay

## 2020-04-04 VITALS — BP 116/72 | HR 80 | Ht 64.0 in | Wt 160.5 lb

## 2020-04-04 DIAGNOSIS — R42 Dizziness and giddiness: Secondary | ICD-10-CM | POA: Diagnosis not present

## 2020-04-04 DIAGNOSIS — R748 Abnormal levels of other serum enzymes: Secondary | ICD-10-CM

## 2020-04-04 NOTE — Progress Notes (Addendum)
Name: Lindsay Neal  MRN/ DOB: [id number], 01-Jan-1966    Age/ Sex: 55 y.o., female    PCP: Darreld Mclean, MD   Reason for Endocrinology Evaluation: Hypoglycemia      Date of Initial Endocrinology Evaluation: 04/04/2020     HPI: Lindsay Neal is a 55 y.o. female with a past medical history of Gastric bypass in 2016 . The patient presented for initial endocrinology clinic visit on 04/04/2020 for consultative assistance with her Hypoglycemia    She presented to her PCP in 02/2020 with concerns of hypoglycemia with a recorded reading of 29 mg/dL on her husband's readings.   She was provided with with a glucose meter , and has not had any low glucose data  She has been having symptoms of dizziness and light headedness  That has been going on for 2 yrs and night sweats as well as fatigue   She had insulin, C-Peptide and proinsulin level in 02/2020 but unfortunately these were drawn with a serum glucose of 85 mg/dL . In review of her records , no serum glucose recorded below 78 mg/dL   Lost 80 lbs  Since her sx    Of note the pt has elevated LFT's since 09/2019  She is on MVI, B12, iron and Vitamin D    Husband has DM  Who is on Glimepiride, Metformin, and Januvia   METER DOWNLOAD SUMMARY: 3/3-3/17/2022  Average Number Tests/Day = 0.6 Overall Mean FS Glucose = 131 Standard Deviation = 35  BG Ranges: Low = 96 High = 191   Hypoglycemic Events/30 Days: BG < 50 = 0 Episodes of symptomatic severe hypoglycemia = 0    HISTORY:  Past Medical History:  Past Medical History:  Diagnosis Date  . Arthritis   . Frequent headaches   . GERD (gastroesophageal reflux disease)   . High cholesterol   . HPV (human papilloma virus) infection   . Sleep apnea   . Urine incontinence    Past Surgical History:  Past Surgical History:  Procedure Laterality Date  . CESAREAN SECTION  04/13/1991  . GALLBLADDER SURGERY  10/08/2014  . GASTRIC BYPASS  10/08/2014  . PLANTAR  FASCIA SURGERY  2015  . SHOULDER SURGERY Right 2014  . VAGINA SURGERY        Social History:  reports that she has never smoked. She has never used smokeless tobacco. She reports that she does not drink alcohol and does not use drugs.  Family History: family history includes Arthritis in her mother and paternal grandmother; Colon cancer in her maternal grandmother; Diabetes in her maternal grandmother and mother; Elevated Lipids in her father, maternal grandfather, and mother; Heart disease in her brother and father; Hypertension in her father and paternal grandmother; Stroke in her father.   HOME MEDICATIONS: Allergies as of 04/04/2020      Reactions   Penicillins    Pollen Extract Other (See Comments)   Stuffy nose   Sulfa Antibiotics       Medication List       Accurate as of April 04, 2020  1:21 PM. If you have any questions, ask your nurse or doctor.        baclofen 10 MG tablet Commonly known as: LIORESAL Take 0.5-1 tablets (5-10 mg total) by mouth 3 (three) times daily as needed for muscle spasms.   blood glucose meter kit and supplies Kit Dispense based on patient and insurance preference. Use up to four times daily as directed. (  FOR ICD-9 250.00, 250.01).   buPROPion 150 MG 12 hr tablet Commonly known as: WELLBUTRIN SR Take 1 tablet (150 mg total) by mouth 2 (two) times daily.   busPIRone 15 MG tablet Commonly known as: BUSPAR Take 1 tablet (15 mg total) by mouth 2 (two) times daily.   calcium citrate-vitamin D 315-200 MG-UNIT tablet Commonly known as: CITRACAL+D Take 1 tablet by mouth daily.   esomeprazole 40 MG capsule Commonly known as: NEXIUM Take 1 capsule (40 mg total) by mouth daily. Take 1 capsule by mouth once daily   etodolac 400 MG tablet Commonly known as: LODINE Take 1 tablet (400 mg total) by mouth 2 (two) times daily as needed.   hydrOXYzine 25 MG capsule Commonly known as: Vistaril Take 1 capsule (25 mg total) by mouth at bedtime as  needed for anxiety.   IRON PO Take 65 mg by mouth daily.   levocetirizine 5 MG tablet Commonly known as: XYZAL Take 1 tablet (5 mg total) by mouth every evening.   levothyroxine 75 MCG tablet Commonly known as: SYNTHROID Take 1 tablet (75 mcg total) by mouth daily before breakfast.   methocarbamol 500 MG tablet Commonly known as: Robaxin Take 1 tablet (500 mg total) by mouth every 8 (eight) hours as needed for muscle spasms.   simvastatin 10 MG tablet Commonly known as: ZOCOR TAKE 1 TABLET BY MOUTH AT BEDTIME         REVIEW OF SYSTEMS: A comprehensive ROS was conducted with the patient and is negative except as per HPI    OBJECTIVE:  VS: BP 116/72   Pulse 80   Ht '5\' 4"'  (1.626 m)   Wt 160 lb 8 oz (72.8 kg)   LMP 12/16/2015   SpO2 98%   BMI 27.55 kg/m      BP Supine 120/80 BP standing 130/80   Wt Readings from Last 3 Encounters:  04/04/20 160 lb 8 oz (72.8 kg)  03/14/20 160 lb (72.6 kg)  01/04/20 158 lb (71.7 kg)     EXAM: General: Pt appears distressed and crying   Neck: General: Supple without adenopathy. Thyroid: Thyroid size normal.  No goiter or nodules appreciated.  Lungs: Clear with good BS bilat with no rales, rhonchi, or wheezes  Heart: Auscultation: RRR.  Abdomen: Normoactive bowel sounds, soft, nontender, without masses or organomegaly palpable  Extremities:  BL LE: No pretibial edema normal ROM and strength.  Skin: Hair: Texture and amount normal with gender appropriate distribution Skin Inspection: No rashes Skin Palpation: Skin temperature, texture, and thickness normal to palpation  Neuro: Cranial nerves: II - XII grossly intact  Motor: Normal strength throughout DTRs: 2+ and symmetric in UE without delay in relaxation phase  Mental Status: Judgment, insight: Intact Orientation: Oriented to time, place, and person Mood and affect: No depression, anxiety, or agitation     DATA REVIEWED:   Results for Lindsay, Neal (MRN  [id number]) as of 04/04/2020 10:16  Ref. Range 03/14/2020 16:17  Sodium Latest Ref Range: 135 - 145 mEq/L 141  Potassium Latest Ref Range: 3.5 - 5.1 mEq/L 4.2  Chloride Latest Ref Range: 96 - 112 mEq/L 104  CO2 Latest Ref Range: 19 - 32 mEq/L 28  Glucose Latest Ref Range: 70 - 99 mg/dL 85  BUN Latest Ref Range: 6 - 23 mg/dL 20  Creatinine Latest Ref Range: 0.40 - 1.20 mg/dL 0.73  Calcium Latest Ref Range: 8.4 - 10.5 mg/dL 9.5  Alkaline Phosphatase Latest Ref Range: 39 - 117 U/L  107  Albumin Latest Ref Range: 3.5 - 5.2 g/dL 4.3  Amylase, Serum Latest Ref Range: 27 - 131 U/L 61  Lipase Latest Ref Range: 11.0 - 59.0 U/L 49.0  AST Latest Ref Range: 0 - 37 U/L 36  ALT Latest Ref Range: 0 - 35 U/L 41 (H)  Total Protein Latest Ref Range: 6.0 - 8.3 g/dL 7.3  Total Bilirubin Latest Ref Range: 0.2 - 1.2 mg/dL 0.3  GFR Latest Ref Range: >60.00 mL/min 93.58  Chlorpropamide Latest Units: mcg/mL None Detected  Glimepiride Latest Units: ng/mL None Detected  Glipizide Latest Units: ng/mL None Detected  Glyburide Latest Units: ng/mL None Detected  Nateglinide Latest Units: mcg/mL None Detected  Repaglinide Latest Units: ng/mL None Detected  Tolazamide Latest Units: mcg/mL None Detected  Tolbutamide Latest Units: mcg/mL None Detected  Beta-Hydroxybutyric Acid Latest Units: mmol/L 0.22  INSULIN Latest Ref Range: 2.6 - 24.9 uIU/mL 5.3  Proinsulin Latest Ref Range: < OR = 18. pmol/L 4.4  C-Peptide Latest Ref Range: 1.1 - 4.4 ng/mL 2.2  TSH Latest Ref Range: 0.35 - 4.50 uIU/mL 4.12    ASSESSMENT/PLAN/RECOMMENDATIONS:   1. Lightheadedness  - She has had lightheadedness for ~ 2 yrs , there are no triggering or exacerbating factors. She does not believe she has a spinning sensation . These symptom are associated with sweating and clammy sensation at times -  She somehow used her husbands meter at some point with a BG of 29 mg/dL (Of note the husband is diabetic and on Glimepiride, Januvia and Metformin)   And based on that info it was though that her symptoms are related to hypoglycemia, she was provided with a glucose meter to use through her PCP 's office and the pt has come to the realization that her symptoms are NOT glucose related and the previous meter was faulty.  - But we discussed high protein diet, avoiding simple carbs and consuming complex carbs  - IN the past month the lowest BG she had was in the low 70's.  - Today she felt dizzy in the morning and BG was 96 mg/dL  - The pt is questioning if hormones have anything to do with her symptoms, I explained to the pt that lightheadedness is not a common presentation for endocrine disorders, the pt questioned menopause but I explained to her that she is at menopause given her age but that menopause caused hot flashes rather the lightheadedness that required her to lay down and would take 5-15 minutes to resolve  - We discussed vasovagal attacks as well as vertigo, cardiac and other neurological differential diagnoses  - Pt would like to have " hormonal check" and will proceed with this, she will stop by the office fasting at 8 AM  - The pt is NOT orthostatic on today's exam , supine BP 120/80 , Standing 130/80  - I will refer her to cardiology for lightheadedness    2. Elevated LFT's :   - Slight elevated but given extreme fatigue will proceed with ultrasound and SMA check      I spent 45 minutes preparing to see the patient by review of recent labs, imaging and procedures, obtaining and reviewing separately obtained history, communicating with the patient/family or caregiver, ordering medications, tests or procedures, and documenting clinical information in the EHR including the differential Dx, treatment, and any further evaluation and other management   F/U if BG's are 55 mg/dL or less      Signed electronically by: Mack Guise, MD  [facility]  [facility] [address] [place], [place] [address] Phone: [phone] FAX: [phone]   CC: Darreld Mclean, [address] [place] [address] Phone: [phone] Fax: [phone]   Return to Endocrinology clinic as below: Future Appointments  Date Time Provider [place]  04/05/2020  8:15 AM [facility] None  04/19/2020  3:40 PM Munley, Hilton Cork, MD [facility] None

## 2020-04-05 ENCOUNTER — Other Ambulatory Visit (INDEPENDENT_AMBULATORY_CARE_PROVIDER_SITE_OTHER): Payer: Managed Care, Other (non HMO)

## 2020-04-05 DIAGNOSIS — R748 Abnormal levels of other serum enzymes: Secondary | ICD-10-CM | POA: Diagnosis not present

## 2020-04-05 DIAGNOSIS — R42 Dizziness and giddiness: Secondary | ICD-10-CM | POA: Diagnosis not present

## 2020-04-05 LAB — SULFONYLUREA HYPOGLYCEMICS PANEL, SERUM
Chlorpropamide: NOT DETECTED ug/mL
Glimepiride: NOT DETECTED ng/mL
Glipizide: NOT DETECTED ng/mL
Glyburide: NOT DETECTED ng/mL
Nateglinide: NOT DETECTED ug/mL
Pioglitazone: NOT DETECTED ng/mL
Repaglinide: NOT DETECTED ng/mL
Rosiglitazone: NOT DETECTED ng/mL
Tolazamide: NOT DETECTED ug/mL
Tolbutamide: NOT DETECTED ug/mL

## 2020-04-05 LAB — COMPREHENSIVE METABOLIC PANEL
ALT: 33 U/L (ref 0–35)
AST: 36 U/L (ref 0–37)
Albumin: 4.1 g/dL (ref 3.5–5.2)
Alkaline Phosphatase: 123 U/L — ABNORMAL HIGH (ref 39–117)
BUN: 15 mg/dL (ref 6–23)
CO2: 29 mEq/L (ref 19–32)
Calcium: 9.8 mg/dL (ref 8.4–10.5)
Chloride: 106 mEq/L (ref 96–112)
Creatinine, Ser: 0.7 mg/dL (ref 0.40–1.20)
GFR: 98.37 mL/min (ref >60.00)
Glucose, Bld: 87 mg/dL (ref 70–99)
Potassium: 4.4 mEq/L (ref 3.5–5.1)
Sodium: 143 mEq/L (ref 135–145)
Total Bilirubin: 0.5 mg/dL (ref 0.2–1.2)
Total Protein: 7 g/dL (ref 6.0–8.3)

## 2020-04-05 LAB — TSH: TSH: 3.01 u[IU]/mL (ref 0.35–4.50)

## 2020-04-05 LAB — PROINSULIN: Proinsulin: 4.4 pmol/L

## 2020-04-05 LAB — GAMMA GT: GGT: 24 U/L (ref 7–51)

## 2020-04-05 LAB — FOLLICLE STIMULATING HORMONE: FSH: 62.9 m[IU]/mL

## 2020-04-05 LAB — BETA-HYDROXYBUTYRATE: Beta-Hydroxybutyric Acid: 0.22 mmol/L

## 2020-04-05 LAB — CORTISOL: Cortisol, Plasma: 8.8 ug/dL

## 2020-04-08 ENCOUNTER — Encounter: Payer: Self-pay | Admitting: Internal Medicine

## 2020-04-09 DIAGNOSIS — R32 Unspecified urinary incontinence: Secondary | ICD-10-CM | POA: Insufficient documentation

## 2020-04-09 DIAGNOSIS — G4730 Sleep apnea, unspecified: Secondary | ICD-10-CM | POA: Insufficient documentation

## 2020-04-09 DIAGNOSIS — R519 Headache, unspecified: Secondary | ICD-10-CM | POA: Insufficient documentation

## 2020-04-09 DIAGNOSIS — B977 Papillomavirus as the cause of diseases classified elsewhere: Secondary | ICD-10-CM | POA: Insufficient documentation

## 2020-04-09 DIAGNOSIS — E7800 Pure hypercholesterolemia, unspecified: Secondary | ICD-10-CM | POA: Insufficient documentation

## 2020-04-09 DIAGNOSIS — M1990 Unspecified osteoarthritis, unspecified site: Secondary | ICD-10-CM | POA: Insufficient documentation

## 2020-04-09 LAB — ANTI-SMOOTH MUSCLE ANTIBODY, IGG: Actin (Smooth Muscle) Antibody (IGG): <20 U (ref <20)

## 2020-04-09 LAB — ESTRADIOL: Estradiol: 18 pg/mL

## 2020-04-09 LAB — ACTH: C206 ACTH: 38 pg/mL (ref 6–50)

## 2020-04-09 LAB — PROLACTIN: Prolactin: 3.3 ng/mL

## 2020-04-16 NOTE — Progress Notes (Signed)
Cardiology Office Note:    Date:  04/17/2020   ID:  Lindsay Neal, DOB [birth year], MRN [id number]  PCP:  Darreld Mclean, MD  Cardiologist:  Shirlee More, MD   Referring MD: Nanci Pina*  ASSESSMENT:    1. Lightheadedness   2. Orthostatic hypotension   3. Myalgia   4. Palpitations    PLAN:    In order of problems listed above:  1. She has an array of symptoms from related or unrelated to orthostatic hypotension but clearly has a problem in my office she is on no provocative medications she has no neuropathy or spine injury.  I suspect is related to fluid losses from her bariatric surgery.  She will initiate Florinef and midodrine track blood pressures at home and follow-up in several weeks. 2. Complaining bitterly of upper extremity proximal muscle pain and weakness she will stop her statin 3. 7-day ZIO monitor to assess for arrhythmia with rapid heart rhythm at night and concerns of atrial arrhythmia especially atrial fibrillation 4. Exercise intolerance atypical symptoms of shoulder pain check cardiac CTA  Next appointment May 2022   Medication Adjustments/Labs and Tests Ordered: Current medicines are reviewed at length with the patient today.  Concerns regarding medicines are outlined above.  No orders of the defined types were placed in this encounter.  No orders of the defined types were placed in this encounter.    Chief Complaint  Patient presents with  . Dizziness    History of Present Illness:    Lindsay Neal is a 55 y.o. female who is being seen today for the evaluation of lightheadedness at the request of Shamleffer, Ibtehal Jar*.  She has a history of obesity and gastric bypass surgery in 2016 and symptomatic hypoglycemia.  When seen by endocrinology 04/04/2020 she was complaining of lightheadedness that had no orthostatic shift in her blood pressure.  Laboratory test showed a potassium of 4.2 creatinine 0.73 GFR 94 cc normal  TSH.  She is previously evaluated [facility] cardiology [facility] 05/16/2016 for chest discomfort shortness of breath and palpitation.  She had a stress echocardiogram performed but I cannot see the results within Care Everywhere.  At that time her magnesium was normal 2.0 potassium 4.8 hemoglobin 12.8.  There is a notation that she had a normal stress echocardiogram performed on 1 months from her partners, currently you and she wore a monitor results pending.  She is very she has not felt well.  Some of the symptoms are quite vague including fatigue muscle weakness and pain headache but central to all this is a feeling of weakness as if she is going to faint particularly of prolonged immobilization or upright posture.  Today in my office her standing blood pressure somewhere in the range of 90 systolic and she is lightheaded she has symptomatic orthostatic hypotension.  I suspect it is related to her bariatric surgery abdominal fluid loss.  She will continue to have water and salt to her diet placement Florinef to help support her blood fall and blood pressure as well as bladder training.  Advised to purchase an upper extremity blood pressure cuff digital and check her blood pressure twice daily supine and standing record. She is complaining bitterly of pain and weakness in the upper extremity muscles.  Percent She has no anginal discomfort but at times it is aching in the shoulders exercise intolerance is quite concerned she has heart disease and for further evaluation cardiac CTA.  I think in combination of a  calcium scoring knowledge of her anatomy will allow [place] to understand whether or not she has coincident coronary artery disease.  Her husband is present participates in evaluation decision making. Past Medical History:  Diagnosis Date  . Arthritis   . Frequent headaches   . GERD (gastroesophageal reflux disease)   . High cholesterol   . HPV (human papilloma virus) infection   . Sleep apnea   .  Urine incontinence     Past Surgical History:  Procedure Laterality Date  . CESAREAN SECTION  04/13/1991  . GALLBLADDER SURGERY  10/08/2014  . GASTRIC BYPASS  10/08/2014  . PLANTAR FASCIA SURGERY  2015  . SHOULDER SURGERY Right 2014  . VAGINA SURGERY      Current Medications: Current Meds  Medication Sig  . baclofen (LIORESAL) 10 MG tablet Take 0.5-1 tablets (5-10 mg total) by mouth 3 (three) times daily as needed for muscle spasms.  . blood glucose meter kit and supplies KIT Dispense based on patient and insurance preference. Use up to four times daily as directed. (FOR ICD-9 250.00, 250.01).  Marland Kitchen buPROPion (WELLBUTRIN SR) 150 MG 12 hr tablet Take 1 tablet (150 mg total) by mouth 2 (two) times daily.  . busPIRone (BUSPAR) 15 MG tablet Take 1 tablet (15 mg total) by mouth 2 (two) times daily.  . calcium citrate-vitamin D (CITRACAL+D) 315-200 MG-UNIT tablet Take 1 tablet by mouth 2 (two) times daily.  Marland Kitchen esomeprazole (NEXIUM) 40 MG capsule Take 1 capsule (40 mg total) by mouth daily. Take 1 capsule by mouth once daily  . etodolac (LODINE) 400 MG tablet Take 1 tablet (400 mg total) by mouth 2 (two) times daily as needed.  . hydrOXYzine (VISTARIL) 25 MG capsule Take 1 capsule (25 mg total) by mouth at bedtime as needed for anxiety.  . IRON PO Take 65 mg by mouth daily.  Marland Kitchen levothyroxine (SYNTHROID) 75 MCG tablet Take 1 tablet (75 mcg total) by mouth daily before breakfast.  . Multiple Vitamins-Minerals (WOMENS MULTI VITAMIN & MINERAL PO) Take 1 tablet by mouth daily.  . simvastatin (ZOCOR) 10 MG tablet TAKE 1 TABLET BY MOUTH AT BEDTIME     Allergies:   Penicillins, Pollen extract, and Sulfa antibiotics   Social History   Socioeconomic History  . Marital status: Married    Spouse name: Not on file  . Number of children: Not on file  . Years of education: Not on file  . Highest education level: Not on file  Occupational History  . Not on file  Tobacco Use  . Smoking status: Never  Smoker  . Smokeless tobacco: Never Used  Vaping Use  . Vaping Use: Never used  Substance and Sexual Activity  . Alcohol use: No  . Drug use: Never  . Sexual activity: Yes    Partners: Male  Other Topics Concern  . Not on file  Social History Narrative  . Not on file   Social Determinants of Health   Financial Resource Strain: Not on file  Food Insecurity: Not on file  Transportation Needs: Not on file  Physical Activity: Not on file  Stress: Not on file  Social Connections: Not on file     Family History: The patient's family history includes Arthritis in her mother and paternal grandmother; Colon cancer in her maternal grandmother; Diabetes in her maternal grandmother and mother; Elevated Lipids in her father, maternal grandfather, and mother; Heart disease in her brother and father; Hypertension in her father and paternal grandmother; Stroke in  her father. There is no history of Stomach cancer, Pancreatic cancer, or Esophageal cancer.  ROS:   ROS Please see the history of present illness.     All other systems reviewed and are negative.  EKGs/Labs/Other Studies Reviewed:    The following studies were reviewed today:   EKG:  EKG is  ordered today.  The ekg ordered today is personally reviewed and demonstrates sinus rhythm and is normal  Recent Labs: 10/12/2019: Hemoglobin 13.8; Platelets 256 04/05/2020: ALT 33; BUN 15; Creatinine, Ser 0.70; Potassium 4.4; Sodium 143; TSH 3.01  Recent Lipid Panel    Component Value Date/Time   CHOL 154 10/12/2019 0933   TRIG 104 10/12/2019 0933   HDL 58 10/12/2019 0933   CHOLHDL 2.7 10/12/2019 0933   VLDL 19.4 11/21/2018 0845   LDLCALC 77 10/12/2019 0933    Physical Exam:    VS:  BP 108/78   Pulse 71   Ht 5' 4" (1.626 m)   Wt 161 lb 0.6 oz (73 kg)   LMP 12/16/2015   SpO2 98%   BMI 27.64 kg/m     Wt Readings from Last 3 Encounters:  04/17/20 161 lb 0.6 oz (73 kg)  04/04/20 160 lb 8 oz (72.8 kg)  03/14/20 160 lb (72.6 kg)     When I do orthostatic blood pressure 774/12 supine 90 systolic standing with symptoms GEN:  Well nourished, well developed in no acute distress HEENT: Normal NECK: No JVD; No carotid bruits LYMPHATICS: No lymphadenopathy CARDIAC: RRR, no murmurs, rubs, gallops RESPIRATORY:  Clear to auscultation without rales, wheezing or rhonchi  ABDOMEN: Soft, non-tender, non-distended MUSCULOSKELETAL:  No edema; No deformity  SKIN: Warm and dry NEUROLOGIC:  Alert and oriented x 3 PSYCHIATRIC:  Normal affect     Signed, Shirlee More, MD  04/17/2020 4:29 PM    [facility]

## 2020-04-17 ENCOUNTER — Encounter: Payer: Self-pay | Admitting: Cardiology

## 2020-04-17 ENCOUNTER — Ambulatory Visit (INDEPENDENT_AMBULATORY_CARE_PROVIDER_SITE_OTHER): Payer: Managed Care, Other (non HMO)

## 2020-04-17 ENCOUNTER — Other Ambulatory Visit: Payer: Self-pay

## 2020-04-17 ENCOUNTER — Ambulatory Visit (INDEPENDENT_AMBULATORY_CARE_PROVIDER_SITE_OTHER): Payer: Managed Care, Other (non HMO) | Admitting: Cardiology

## 2020-04-17 VITALS — BP 108/78 | HR 71 | Ht 64.0 in | Wt 161.0 lb

## 2020-04-17 DIAGNOSIS — R42 Dizziness and giddiness: Secondary | ICD-10-CM | POA: Diagnosis not present

## 2020-04-17 DIAGNOSIS — R079 Chest pain, unspecified: Secondary | ICD-10-CM

## 2020-04-17 DIAGNOSIS — R002 Palpitations: Secondary | ICD-10-CM | POA: Diagnosis not present

## 2020-04-17 DIAGNOSIS — I951 Orthostatic hypotension: Secondary | ICD-10-CM | POA: Diagnosis not present

## 2020-04-17 DIAGNOSIS — M791 Myalgia, unspecified site: Secondary | ICD-10-CM | POA: Diagnosis not present

## 2020-04-17 MED ORDER — METOPROLOL TARTRATE 100 MG PO TABS: 100.0000 mg | ORAL_TABLET | Freq: Once | ORAL | 0 refills | Status: DC

## 2020-04-17 MED ORDER — FLUDROCORTISONE ACETATE 0.1 MG PO TABS: 0.1000 mg | ORAL_TABLET | Freq: Every day | ORAL | 3 refills | Status: DC

## 2020-04-17 MED ORDER — MIDODRINE HCL 5 MG PO TABS: 5.0000 mg | ORAL_TABLET | Freq: Two times a day (BID) | ORAL | 3 refills | Status: DC

## 2020-04-17 NOTE — Patient Instructions (Addendum)
Medication Instructions:  Your physician has recommended you make the following change in your medication:  START: Florinef 0.1 mg take one tablet by mouth daily.  START: Midodrine 5 mg take one tablet by mouth twice daily.  STOP: Simvastatin  *If you need a refill on your cardiac medications before your next appointment, please call your pharmacy*   Lab Work: Your physician recommends that you return for lab work in: WITHIN ONE WEEK OF YOUR CARDIAC CT  BMP  If you have labs (blood work) drawn today and your tests are completely normal, you will receive your results only by: Marland Kitchen MyChart Message (if you have MyChart) OR . A paper copy in the mail If you have any lab test that is abnormal or we need to change your treatment, we will call you to review the results.   Testing/Procedures: A zio monitor was ordered today. It will remain on for 7 days. You will then return monitor and event diary in provided box. It takes 1-2 weeks for report to be downloaded and returned to [place]. We will call you with the results. If monitor falls off or has orange flashing light, please call Zio for further instructions.   Your cardiac CT will be scheduled at the below location:   [facility] [address] [place], [place] [address] [phone]  If scheduled at [facility], please arrive at the [facility] main entrance (entrance A) of [facility] 30 minutes prior to test start time. Proceed to the [facility] Radiology Department (first floor) to check-in and test prep.  Please follow these instructions carefully (unless otherwise directed):   On the Night Before the Test: . Be sure to Drink plenty of water. . Do not consume any caffeinated/decaffeinated beverages or chocolate 12 hours prior to your test. . Do not take any antihistamines 12 hours prior to your test.  On the Day of the Test: . Drink plenty of water until 1 hour prior to the test. . Do not eat any food 4  hours prior to the test. . You may take your regular medications prior to the test.  . FEMALES- please wear underwire-free bra if available      After the Test: . Drink plenty of water. . After receiving IV contrast, you may experience a mild flushed feeling. This is normal. . On occasion, you may experience a mild rash up to 24 hours after the test. This is not dangerous. If this occurs, you can take Benadryl 25 mg and increase your fluid intake. . If you experience trouble breathing, this can be serious. If it is severe call 911 IMMEDIATELY. If it is mild, please call our office. . If you take any of these medications: Glipizide/Metformin, Avandament, Glucavance, please do not take 48 hours after completing test unless otherwise instructed.   Once we have confirmed authorization from your insurance company, we will call you to set up a date and time for your test. Based on how quickly your insurance processes prior authorizations requests, please allow up to 4 weeks to be contacted for scheduling your Cardiac CT appointment. Be advised that routine Cardiac CT appointments could be scheduled as many as 8 weeks after your provider has ordered it.  For non-scheduling related questions, please contact the cardiac imaging nurse navigator should you have any questions/concerns: Rockwell Alexandria, Cardiac Imaging Nurse Navigator Larey Brick, Cardiac Imaging Nurse Navigator [facility] Direct Office Dial: [phone]   For  scheduling needs, including cancellations and rescheduling, please call [place], [phone].     Follow-Up: At [facility], you and your health needs are our priority.  As part of our continuing mission to provide you with exceptional heart care, we have created designated Provider Care Teams.  These Care Teams include your primary Cardiologist (physician) and Advanced Practice Providers (APPs -  Physician Assistants and Nurse Practitioners) who  all work together to provide you with the care you need, when you need it.  We recommend signing up for the patient portal called "MyChart".  Sign up information is provided on this After Visit Summary.  MyChart is used to connect with patients for Virtual Visits (Telemedicine).  Patients are able to view lab/test results, encounter notes, upcoming appointments, etc.  Non-urgent messages can be sent to your provider as well.   To learn more about what you can do with MyChart, go to [email or web address].    Your next appointment:   2 month(s)  The format for your next appointment:   In Person  Provider:   Norman Herrlich, MD   Other Instructions

## 2020-04-19 ENCOUNTER — Ambulatory Visit: Payer: Managed Care, Other (non HMO) | Admitting: Cardiology

## 2020-04-22 ENCOUNTER — Other Ambulatory Visit: Payer: Managed Care, Other (non HMO)

## 2020-04-24 ENCOUNTER — Telehealth (HOSPITAL_COMMUNITY): Payer: Self-pay | Admitting: Emergency Medicine

## 2020-04-24 ENCOUNTER — Encounter: Payer: Self-pay | Admitting: Family Medicine

## 2020-04-24 DIAGNOSIS — R002 Palpitations: Secondary | ICD-10-CM | POA: Diagnosis not present

## 2020-04-24 NOTE — Telephone Encounter (Signed)
Forms faxed into front office Place into [place] folder

## 2020-04-24 NOTE — Telephone Encounter (Signed)
Reaching out to patient to offer assistance regarding upcoming cardiac imaging study; pt verbalizes understanding of appt date/time, parking situation and where to check in, pre-test NPO status and medications ordered, and verified current allergies; name and call back number provided for further questions should they arise Rockwell Alexandria RN Navigator Cardiac Imaging [facility] [phone] office [phone] cell  Pt takes midodrine to increase her BP. Pt aware that she will get NTG on site for scan and it MAY drop her BP. She understands that she might get lightheaded and may need IV fluids to help bring the BP back up [place]

## 2020-04-25 NOTE — Telephone Encounter (Signed)
FMLA has been faxed. Copy sent to scan. Original on my desk in "faxed folder" until it appears in media.

## 2020-04-26 ENCOUNTER — Other Ambulatory Visit: Payer: Self-pay

## 2020-04-26 ENCOUNTER — Ambulatory Visit: Payer: Managed Care, Other (non HMO) | Admitting: Internal Medicine

## 2020-04-26 ENCOUNTER — Ambulatory Visit (HOSPITAL_COMMUNITY): Payer: Managed Care, Other (non HMO)

## 2020-04-26 ENCOUNTER — Ambulatory Visit (HOSPITAL_COMMUNITY)
Admission: RE | Admit: 2020-04-26 | Discharge: 2020-04-26 | Disposition: A | Discharge status: Home / Self Care | Payer: Managed Care, Other (non HMO) | Source: Ambulatory Visit | Attending: Cardiology | Admitting: Cardiology

## 2020-04-26 DIAGNOSIS — R079 Chest pain, unspecified: Secondary | ICD-10-CM | POA: Diagnosis present

## 2020-04-26 DIAGNOSIS — I700 Atherosclerosis of aorta: Secondary | ICD-10-CM | POA: Diagnosis not present

## 2020-04-26 LAB — BASIC METABOLIC PANEL
BUN/Creatinine Ratio: 12 (ref 9–23)
BUN: 8 mg/dL (ref 6–24)
CO2: 23 mmol/L (ref 20–29)
Calcium: 8.8 mg/dL (ref 8.7–10.2)
Chloride: 104 mmol/L (ref 96–106)
Creatinine, Ser: 0.68 mg/dL (ref 0.57–1.00)
Glucose: 85 mg/dL (ref 65–99)
Potassium: 4.4 mmol/L (ref 3.5–5.2)
Sodium: 141 mmol/L (ref 134–144)
eGFR: 103 mL/min/{1.73_m2} (ref >59)

## 2020-04-26 MED ORDER — NITROGLYCERIN 0.4 MG SL SUBL: 0.8000 mg | SUBLINGUAL_TABLET | Freq: Once | SUBLINGUAL | Status: AC

## 2020-04-26 MED ORDER — IOHEXOL 350 MG/ML SOLN
90.0000 mL | Freq: Once | INTRAVENOUS | Status: AC | PRN
  Administered 2020-04-26: 90 mL via INTRAVENOUS

## 2020-04-26 MED ORDER — NITROGLYCERIN 0.4 MG SL SUBL
SUBLINGUAL_TABLET | SUBLINGUAL | Status: AC
  Administered 2020-04-26: 0.8 mg via SUBLINGUAL
  Filled 2020-04-26: qty 2

## 2020-05-03 ENCOUNTER — Telehealth: Payer: Self-pay

## 2020-05-03 NOTE — Telephone Encounter (Signed)
-----   Message from Baldo Daub, MD sent at 05/03/2020 12:32 PM EDT ----- Good result her monitor is normal.

## 2020-05-03 NOTE — Telephone Encounter (Signed)
Spoke with patient regarding results and recommendation.  Patient verbalizes understanding and is agreeable to plan of care. Advised patient to call back with any issues or concerns.  

## 2020-05-11 ENCOUNTER — Other Ambulatory Visit: Payer: Self-pay | Admitting: Family Medicine

## 2020-05-31 ENCOUNTER — Ambulatory Visit: Payer: Managed Care, Other (non HMO) | Admitting: Cardiology

## 2020-06-12 ENCOUNTER — Other Ambulatory Visit: Payer: Self-pay | Admitting: Family Medicine

## 2020-07-01 ENCOUNTER — Other Ambulatory Visit: Payer: Self-pay | Admitting: Family Medicine

## 2020-08-10 ENCOUNTER — Other Ambulatory Visit: Payer: Self-pay | Admitting: Family Medicine

## 2020-08-10 DIAGNOSIS — F329 Major depressive disorder, single episode, unspecified: Secondary | ICD-10-CM

## 2020-09-03 ENCOUNTER — Other Ambulatory Visit: Payer: Self-pay | Admitting: Family Medicine

## 2020-09-03 DIAGNOSIS — F329 Major depressive disorder, single episode, unspecified: Secondary | ICD-10-CM

## 2020-09-20 ENCOUNTER — Other Ambulatory Visit: Payer: Self-pay | Admitting: Family Medicine

## 2020-09-20 DIAGNOSIS — F329 Major depressive disorder, single episode, unspecified: Secondary | ICD-10-CM

## 2020-10-03 ENCOUNTER — Other Ambulatory Visit: Payer: Self-pay | Admitting: Family Medicine

## 2020-10-09 ENCOUNTER — Other Ambulatory Visit: Payer: Self-pay | Admitting: Family Medicine

## 2020-10-09 DIAGNOSIS — F329 Major depressive disorder, single episode, unspecified: Secondary | ICD-10-CM

## 2020-11-05 ENCOUNTER — Telehealth: Payer: Self-pay | Admitting: General Practice

## 2020-11-05 NOTE — Telephone Encounter (Signed)
Left message on VM for patient to contact our office to schedule new patient appointment with Dr. Erin Fulling in December.

## 2020-11-11 ENCOUNTER — Encounter: Payer: Self-pay | Admitting: Family Medicine

## 2020-11-11 DIAGNOSIS — F329 Major depressive disorder, single episode, unspecified: Secondary | ICD-10-CM

## 2020-11-11 MED ORDER — TRAZODONE HCL 50 MG PO TABS: 25.0000 mg | ORAL_TABLET | Freq: Every evening | ORAL | 3 refills | Status: DC | PRN

## 2020-11-12 ENCOUNTER — Other Ambulatory Visit: Payer: Self-pay | Admitting: Family Medicine

## 2020-11-28 ENCOUNTER — Other Ambulatory Visit: Payer: Self-pay | Admitting: Family Medicine

## 2020-12-03 MED ORDER — ESOMEPRAZOLE MAGNESIUM 40 MG PO CPDR: 40.0000 mg | DELAYED_RELEASE_CAPSULE | Freq: Every day | ORAL | 3 refills | Status: DC

## 2020-12-03 MED ORDER — BUPROPION HCL ER (SR) 150 MG PO TB12: 150.0000 mg | ORAL_TABLET | Freq: Two times a day (BID) | ORAL | 0 refills | Status: DC

## 2020-12-03 MED ORDER — BUSPIRONE HCL 15 MG PO TABS: 15.0000 mg | ORAL_TABLET | Freq: Two times a day (BID) | ORAL | 1 refills | Status: DC

## 2020-12-03 MED ORDER — LEVOTHYROXINE SODIUM 75 MCG PO TABS: 75.0000 ug | ORAL_TABLET | Freq: Every day | ORAL | 1 refills | Status: DC

## 2020-12-03 MED ORDER — TRAZODONE HCL 50 MG PO TABS: 25.0000 mg | ORAL_TABLET | Freq: Every evening | ORAL | 0 refills | Status: DC | PRN

## 2020-12-03 NOTE — Addendum Note (Signed)
Addended by: Mertha Finders on: 12/03/2020 08:59 AM   Modules accepted: Orders

## 2020-12-05 ENCOUNTER — Other Ambulatory Visit: Payer: Self-pay | Admitting: Family Medicine

## 2020-12-05 DIAGNOSIS — F329 Major depressive disorder, single episode, unspecified: Secondary | ICD-10-CM

## 2020-12-06 ENCOUNTER — Other Ambulatory Visit: Payer: Self-pay | Admitting: Family Medicine

## 2020-12-09 ENCOUNTER — Encounter: Payer: Self-pay | Admitting: Cardiology

## 2020-12-18 ENCOUNTER — Telehealth: Payer: Self-pay | Admitting: Cardiology

## 2020-12-18 MED ORDER — FLUDROCORTISONE ACETATE 0.1 MG PO TABS: 0.1000 mg | ORAL_TABLET | Freq: Every day | ORAL | 0 refills | Status: DC

## 2020-12-18 MED ORDER — MIDODRINE HCL 5 MG PO TABS: 5.0000 mg | ORAL_TABLET | Freq: Two times a day (BID) | ORAL | 0 refills | Status: DC

## 2020-12-18 NOTE — Telephone Encounter (Signed)
Refills sent in per request 

## 2020-12-18 NOTE — Telephone Encounter (Signed)
Pt c/o medication issue:  1. Name of Medication: fludrocortisone (FLORINEF) 0.1 MG tablet; midodrine (PROAMATINE) 5 MG tablet  2. How are you currently taking this medication (dosage and times per day)? Take 1 tablet (0.1 mg total) by mouth daily;   3. Are you having a reaction (difficulty breathing--STAT)? No   4. What is your medication issue? Dr. Dulce Sellar has to approve both medications before sending prescriptions to [facility] - [place], [place] - [address]

## 2020-12-24 ENCOUNTER — Telehealth: Payer: Self-pay | Admitting: Cardiology

## 2020-12-24 MED ORDER — FLUDROCORTISONE ACETATE 0.1 MG PO TABS: 0.1000 mg | ORAL_TABLET | Freq: Every day | ORAL | 3 refills | Status: DC

## 2020-12-24 NOTE — Telephone Encounter (Signed)
Refill sent in per request.  

## 2020-12-24 NOTE — Telephone Encounter (Signed)
*  STAT* If patient is at the pharmacy, call can be transferred to refill team.   1. Which medications need to be refilled? (please list name of each medication and dose if known)  fludrocortisone (FLORINEF) 0.1 MG tablet  2. Which pharmacy/location (including street and city if local pharmacy) is medication to be sent to? [facility] - [place], [place] - [address]  3. Do they need a 30 day or 90 day supply? 90 with refills   Patient is scheduled to see Dr. Dulce Sellar 12/27/20. She gets the medication from the mail order service and would like to get the process started now

## 2020-12-25 ENCOUNTER — Encounter: Payer: Self-pay | Admitting: Obstetrics & Gynecology

## 2020-12-25 ENCOUNTER — Ambulatory Visit (INDEPENDENT_AMBULATORY_CARE_PROVIDER_SITE_OTHER): Payer: Managed Care, Other (non HMO) | Admitting: Obstetrics & Gynecology

## 2020-12-25 ENCOUNTER — Other Ambulatory Visit: Payer: Self-pay

## 2020-12-25 ENCOUNTER — Other Ambulatory Visit (HOSPITAL_COMMUNITY)
Admission: RE | Admit: 2020-12-25 | Discharge: 2020-12-25 | Disposition: A | Discharge status: Home / Self Care | Payer: Managed Care, Other (non HMO) | Source: Ambulatory Visit | Attending: Obstetrics & Gynecology | Admitting: Obstetrics & Gynecology

## 2020-12-25 VITALS — BP 122/64 | HR 75 | Ht 64.0 in | Wt 147.0 lb

## 2020-12-25 DIAGNOSIS — R6882 Decreased libido: Secondary | ICD-10-CM | POA: Diagnosis not present

## 2020-12-25 DIAGNOSIS — Z01419 Encounter for gynecological examination (general) (routine) without abnormal findings: Secondary | ICD-10-CM | POA: Diagnosis present

## 2020-12-25 DIAGNOSIS — N952 Postmenopausal atrophic vaginitis: Secondary | ICD-10-CM | POA: Diagnosis not present

## 2020-12-25 MED ORDER — ESTRADIOL 0.1 MG/GM VA CREA: 1.0000 | TOPICAL_CREAM | Freq: Every day | VAGINAL | 2 refills | Status: DC

## 2020-12-25 NOTE — Progress Notes (Signed)
Subjective:     Lindsay Neal is a 55 y.o. female here for a routine exam.  G2P1011 Current complaints: lives in [place]. Changed her care because she now works from home and our office is closer to where she lives. Is s/p gastric sleeve. Lost 80#s and has kept it off. Married. LMP 3 years prev. <      Gynecologic History Patient's last menstrual period was 12/16/2015. Contraception: post menopausal status Last Pap: no h/o abnormal PAPs Last mammogram: 11/21. Results were: normal  Obstetric History OB History  Gravida Para Term Preterm AB Living  2 1 1   1 1   SAB IAB Ectopic Multiple Live Births    1     1    # Outcome Date GA Lbr Len/2nd Weight Sex Delivery Anes PTL Lv  2 Term  [redacted]w[redacted]d   F CS-Unspec     1 IAB              The following portions of the patient's history were reviewed and updated as appropriate: allergies, current medications, past family history, past medical history, past social history, past surgical history, and problem list.  Review of Systems Pertinent items are noted in HPI.    Objective:  BP 122/64   Pulse 75   Ht 5\' 4"  (1.626 m)   Wt 147 lb (66.7 kg)   LMP 12/16/2015   BMI 25.23 kg/m   General Appearance:    Alert, cooperative, no distress, appears stated age  Head:    Normocephalic, without obvious abnormality, atraumatic  Eyes:    conjunctiva/corneas clear, EOM's intact, both eyes  Ears:    Normal external ear canals, both ears  Nose:   Nares normal, septum midline, mucosa normal, no drainage    or sinus tenderness  Throat:   Lips, mucosa, and tongue normal; teeth and gums normal  Neck:   Supple, symmetrical, trachea midline, no adenopathy;    thyroid:  no enlargement/tenderness/nodules  Back:     Symmetric, no curvature, ROM normal, no CVA tenderness  Lungs:     respirations unlabored  Chest Wall:    No tenderness or deformity   Heart:    Regular rate and rhythm  Breast Exam:    No tenderness, masses, or nipple abnormality  Abdomen:      Soft, non-tender, bowel sounds active all four quadrants,    no masses, no organomegaly  Genitalia:    Normal female without lesion, discharge or tenderness   Atrophy noted.   Extremities:   Extremities normal, atraumatic, no cyanosis or edema  Pulses:   2+ and symmetric all extremities  Skin:   Skin color, texture, turgor normal, no rashes or lesions    Assessment:    Healthy female exam.    Plan:  Albena was seen today for gynecologic exam.  Diagnoses and all orders for this visit:  Well woman exam -     Cytology - PAP( [place])  Vaginal atrophy -     estradiol (ESTRACE VAGINAL) 0.1 MG/GM vaginal cream; Place 1 Applicatorful vaginally daily. For 2 weeks then 3x/ week (Patient not taking: Reported on 12/27/2020)  Decreased libido   F/u in 6 weeks for f/u of atrophy F/u in 1 year for annual.   Sallie Maker L. Harraway-Smith, M.D., [name]

## 2020-12-25 NOTE — Progress Notes (Signed)
Patient has been postmenopausal for three years. Patient states she has mammogram scheduled with imaging in [place] in Jan. 2023. Last mammogram WNL on 12-04-19.Armandina Stammer RN

## 2020-12-26 NOTE — Progress Notes (Signed)
Cardiology Office Note:    Date:  12/27/2020   ID:  Lindsay Neal, DOB 03/13/1965, MRN [id number]  PCP:  Pearline Cables, MD  Cardiologist:  Norman Herrlich, MD    Referring MD: Pearline Cables, MD    ASSESSMENT:    1. Orthostatic hypotension   2. Chest pain of uncertain etiology    PLAN:    In order of problems listed above:  she continues to do well with resolution of symptomatic orthostatic type tension and after discussion wants to continue with current medical treatment and likely.  She had lab work performed in the summer 2004 07/2020 creatinine 0.68 potassium 4.1 is due for wellness exam in the next few weeks Reassuring cardiac CTA having no further chest pain   Next appointment: 1 year   Medication Adjustments/Labs and Tests Ordered: Current medicines are reviewed at length with the patient today.  Concerns regarding medicines are outlined above.  No orders of the defined types were placed in this encounter.  Meds ordered this encounter  Medications   DISCONTD: midodrine (PROAMATINE) 5 MG tablet    Sig: Take 1 tablet (5 mg total) by mouth 2 (two) times daily with a meal. Please schedule appointment for further refills    Dispense:  180 tablet    Refill:  3    Please schedule appointment for further refills   midodrine (PROAMATINE) 5 MG tablet    Sig: Take 1 tablet (5 mg total) by mouth 2 (two) times daily with a meal.    Dispense:  180 tablet    Refill:  3     History of Present Illness:    Lindsay Neal is a 55 y.o. female with a hx of lightheadedness last seen 04/17/2020.  She has a history of obesity and gastric bypass surgery in 2016 and symptomatic hypoglycemia. When seen by endocrinology 04/04/2020 she was complaining of lightheadedness that had no orthostatic shift in her blood pressure.  Laboratory test showed a potassium of 4.2 creatinine 0.73 GFR 94 cc normal TSH.   She was previously evaluated [facility] cardiology [facility] 05/16/2016  for chest discomfort shortness of breath and palpitation.  She had a stress echocardiogram performed but I cannot see the results within Care Everywhere.  At that time her magnesium was normal 2.0 potassium 4.8 hemoglobin 12.8.  There is a notation that she had a normal stress echocardiogram.  Compliance with diet, lifestyle and medications: Yes  I reviewed the results of her cardiac CTA and she is quite reassured. Dorene Grebe is here with lightheadedness and hypotension resolved with midodrine and Florinef but her overall sense of malaise and diffuse joint and muscle pain are resolved.  She is quite pleased with the quality of her life no cardiovascular symptoms of edema shortness of breath orthopnea chest pain palpitation or syncope.  Cardiac CTA 04/26/2020: IMPRESSION: 1. Coronary calcium score of 0.4. This was 76th percentile for age, sex, and race matched control.   2. Normal coronary origin with Right dominance.   3. CAD-RADS 1. Minimal non-obstructive CAD (1-24%). Consider non-atherosclerotic causes of chest pain. Consider preventive therapy and risk factor modification.   4. Ascending aortic atherosclerosis.  Event monitor 04/17/2020: Study Highlights    Patch Wear Time:  7 days and 0 hours (2022-03-30T16:45:52-0400 to 2022-04-06T17:13:07-398)   Patient had a min HR of 56 bpm, max HR of 134 bpm, and avg HR of 82 bpm. Predominant underlying rhythm was Sinus Rhythm. Isolated SVEs were rare (<1.0%), SVE Couplets were rare (<  1.0%), and SVE Triplets were rare (<1.0%). Isolated VEs were rare (<1.0%), and no VE Couplets or VE Triplets were present.    There were 9 triggered headache diary events all associated with sinus rhythm rate 68 106 bpm. There were no pauses of 3 seconds or greater and no episodes of second or third-degree AV nodal block or sinus node exit block. Ventricular ectopy was rare. Supraventricular ectopy was rare no episodes of atrial fibrillation flutter or SVT.    Conclusion normal event monitor. Past Medical History:  Diagnosis Date   Arthritis    Frequent headaches    GERD (gastroesophageal reflux disease)    High cholesterol    HPV (human papilloma virus) infection    Sleep apnea    Urine incontinence     Past Surgical History:  Procedure Laterality Date   CESAREAN SECTION  04/13/1991   GALLBLADDER SURGERY  10/08/2014   GASTRIC BYPASS  10/08/2014   PLANTAR FASCIA SURGERY  2015   SHOULDER SURGERY Right 2014   VAGINA SURGERY     vaginal tear repair    Current Medications: Current Meds  Medication Sig   baclofen (LIORESAL) 10 MG tablet Take 0.5-1 tablets (5-10 mg total) by mouth 3 (three) times daily as needed for muscle spasms.   buPROPion (WELLBUTRIN SR) 150 MG 12 hr tablet Take 1 tablet (150 mg total) by mouth 2 (two) times daily.   busPIRone (BUSPAR) 15 MG tablet Take 1 tablet (15 mg total) by mouth 2 (two) times daily.   calcium citrate-vitamin D (CITRACAL+D) 315-200 MG-UNIT tablet Take 1 tablet by mouth 2 (two) times daily.   esomeprazole (NEXIUM) 40 MG capsule Take 1 capsule (40 mg total) by mouth daily. Take 1 capsule by mouth once daily   etodolac (LODINE) 400 MG tablet Take 1 tablet (400 mg total) by mouth 2 (two) times daily as needed.   fludrocortisone (FLORINEF) 0.1 MG tablet Take 1 tablet (0.1 mg total) by mouth daily. Please schedule appointment for further refills   hydrOXYzine (VISTARIL) 25 MG capsule TAKE 1 CAPSULE BY MOUTH AT BEDTIME AS NEEDED FOR ANXIETY   IRON PO Take 65 mg by mouth daily.   levothyroxine (SYNTHROID) 75 MCG tablet Take 1 tablet (75 mcg total) by mouth daily before breakfast.   methocarbamol (ROBAXIN) 500 MG tablet TAKE 1 TABLET BY MOUTH EVERY 8 HOURS AS NEEDED FOR MUSCLE SPASM   Multiple Vitamins-Minerals (WOMENS MULTI VITAMIN & MINERAL PO) Take 1 tablet by mouth daily.   traZODone (DESYREL) 50 MG tablet Take 0.5-1 tablets (25-50 mg total) by mouth at bedtime as needed for sleep.   [DISCONTINUED]  midodrine (PROAMATINE) 5 MG tablet Take 1 tablet (5 mg total) by mouth 2 (two) times daily with a meal. Please schedule appointment for further refills     Allergies:   Penicillins, Pollen extract, and Sulfa antibiotics   Social History   Socioeconomic History   Marital status: Married    Spouse name: Not on file   Number of children: Not on file   Years of education: Not on file   Highest education level: Not on file  Occupational History   Not on file  Tobacco Use   Smoking status: Never   Smokeless tobacco: Never  Vaping Use   Vaping Use: Never used  Substance and Sexual Activity   Alcohol use: No   Drug use: Never   Sexual activity: Yes    Partners: Male  Other Topics Concern   Not on file  Social  History Narrative   Not on file   Social Determinants of Health   Financial Resource Strain: Not on file  Food Insecurity: Not on file  Transportation Needs: Not on file  Physical Activity: Not on file  Stress: Not on file  Social Connections: Not on file     Family History: The patient's family history includes Arthritis in her mother and paternal grandmother; Colon cancer in her maternal grandmother; Diabetes in her maternal grandmother and mother; Elevated Lipids in her father, maternal grandfather, and mother; Heart disease in her brother and father; Hypertension in her father and paternal grandmother; Stroke in her father. There is no history of Stomach cancer, Pancreatic cancer, or Esophageal cancer. ROS:   Please see the history of present illness.    All other systems reviewed and are negative.  EKGs/Labs/Other Studies Reviewed:    The following studies were reviewed today:   Recent Labs: 04/05/2020: ALT 33; TSH 3.01 04/25/2020: BUN 8; Creatinine, Ser 0.68; Potassium 4.4; Sodium 141  Recent Lipid Panel    Component Value Date/Time   CHOL 154 10/12/2019 0933   TRIG 104 10/12/2019 0933   HDL 58 10/12/2019 0933   CHOLHDL 2.7 10/12/2019 0933   VLDL 19.4  11/21/2018 0845   LDLCALC 77 10/12/2019 0933    Physical Exam:    VS:  BP 128/82   Pulse 68   Ht 5\' 4"  (1.626 m)   Wt 145 lb 9.6 oz (66 kg)   LMP 12/16/2015   SpO2 99%   BMI 24.99 kg/m     Wt Readings from Last 3 Encounters:  12/27/20 145 lb 9.6 oz (66 kg)  12/25/20 147 lb (66.7 kg)  04/17/20 161 lb 0.6 oz (73 kg)  Repeat blood pressure by me 112/70 sitting 120/80 standing  GEN:  Well nourished, well developed in no acute distress HEENT: Normal NECK: No JVD; No carotid bruits LYMPHATICS: No lymphadenopathy CARDIAC: RRR, no murmurs, rubs, gallops RESPIRATORY:  Clear to auscultation without rales, wheezing or rhonchi  ABDOMEN: Soft, non-tender, non-distended MUSCULOSKELETAL:  No edema; No deformity  SKIN: Warm and dry NEUROLOGIC:  Alert and oriented x 3 PSYCHIATRIC:  Normal affect    Signed, 04/19/20, MD  12/27/2020 4:39 PM    [facility]

## 2020-12-27 ENCOUNTER — Other Ambulatory Visit: Payer: Self-pay

## 2020-12-27 ENCOUNTER — Encounter: Payer: Self-pay | Admitting: Cardiology

## 2020-12-27 ENCOUNTER — Ambulatory Visit (INDEPENDENT_AMBULATORY_CARE_PROVIDER_SITE_OTHER): Payer: Managed Care, Other (non HMO) | Admitting: Cardiology

## 2020-12-27 VITALS — BP 128/82 | HR 68 | Ht 64.0 in | Wt 145.6 lb

## 2020-12-27 DIAGNOSIS — R079 Chest pain, unspecified: Secondary | ICD-10-CM

## 2020-12-27 DIAGNOSIS — I951 Orthostatic hypotension: Secondary | ICD-10-CM

## 2020-12-27 MED ORDER — MIDODRINE HCL 5 MG PO TABS: 5.0000 mg | ORAL_TABLET | Freq: Two times a day (BID) | ORAL | 3 refills | Status: DC

## 2020-12-27 NOTE — Patient Instructions (Signed)
,  Medication Instructions:  Your physician recommends that you continue on your current medications as directed. Please refer to the Current Medication list given to you today.  *If you need a refill on your cardiac medications before your next appointment, please call your pharmacy*   Lab Work: None If you have labs (blood work) drawn today and your tests are completely normal, you will receive your results only by: MyChart Message (if you have MyChart) OR A paper copy in the mail If you have any lab test that is abnormal or we need to change your treatment, we will call you to review the results.   Testing/Procedures: None   Follow-Up: At [facility], you and your health needs are our priority.  As part of our continuing mission to provide you with exceptional heart care, we have created designated Provider Care Teams.  These Care Teams include your primary Cardiologist (physician) and Advanced Practice Providers (APPs -  Physician Assistants and Nurse Practitioners) who all work together to provide you with the care you need, when you need it.  We recommend signing up for the patient portal called "MyChart".  Sign up information is provided on this After Visit Summary.  MyChart is used to connect with patients for Virtual Visits (Telemedicine).  Patients are able to view lab/test results, encounter notes, upcoming appointments, etc.  Non-urgent messages can be sent to your provider as well.   To learn more about what you can do with MyChart, go to [email or web address].    Your next appointment:   1 year(s)  The format for your next appointment:   In Person  Provider:   Norman Herrlich, MD    Other Instructions

## 2020-12-30 ENCOUNTER — Telehealth: Payer: Self-pay | Admitting: Family Medicine

## 2020-12-30 LAB — CYTOLOGY - PAP
Comment: NEGATIVE
Diagnosis: NEGATIVE
High risk HPV: NEGATIVE

## 2020-12-30 NOTE — Telephone Encounter (Signed)
Ok with me 

## 2020-12-30 NOTE — Telephone Encounter (Signed)
OK w me.  

## 2020-12-30 NOTE — Telephone Encounter (Signed)
Pt was requesting to switch to [facility], please advise.

## 2020-12-31 NOTE — Telephone Encounter (Signed)
Scheduled cpe 1/11 with Dr. Carmelia Roller

## 2021-01-28 ENCOUNTER — Encounter: Payer: Self-pay | Admitting: Family Medicine

## 2021-01-29 ENCOUNTER — Ambulatory Visit (INDEPENDENT_AMBULATORY_CARE_PROVIDER_SITE_OTHER): Payer: Managed Care, Other (non HMO) | Admitting: Family Medicine

## 2021-01-29 ENCOUNTER — Encounter: Payer: Self-pay | Admitting: Family Medicine

## 2021-01-29 VITALS — BP 104/62 | HR 79 | Temp 98.1°F | Ht 64.0 in | Wt 145.5 lb

## 2021-01-29 DIAGNOSIS — E034 Atrophy of thyroid (acquired): Secondary | ICD-10-CM

## 2021-01-29 DIAGNOSIS — Z1239 Encounter for other screening for malignant neoplasm of breast: Secondary | ICD-10-CM | POA: Diagnosis not present

## 2021-01-29 DIAGNOSIS — Z0000 Encounter for general adult medical examination without abnormal findings: Secondary | ICD-10-CM

## 2021-01-29 NOTE — Progress Notes (Signed)
Chief Complaint  Patient presents with   Annual Exam     Well Woman Lindsay Neal is here for a complete physical.   Her last physical was >1 year ago.  Current diet: in general, a "healthy" diet. Current exercise: walking. Weight is stable and she denies fatigue out of ordinary. Seatbelt? Yes Advanced directive? No  Health Maintenance Pap/HPV- Yes Mammogram- Due Colon cancer screening-Yes Shingrix- Yes Tetanus- Yes Hep C screening- Yes HIV screening- Yes  Past Medical History:  Diagnosis Date   Arthritis    Frequent headaches    GERD (gastroesophageal reflux disease)    High cholesterol    HPV (human papilloma virus) infection    Sleep apnea    Urine incontinence      Past Surgical History:  Procedure Laterality Date   CESAREAN SECTION  04/13/1991   GALLBLADDER SURGERY  10/08/2014   GASTRIC BYPASS  10/08/2014   PLANTAR FASCIA SURGERY  2015   SHOULDER SURGERY Right 2014   VAGINA SURGERY     vaginal tear repair    Medications  Current Outpatient Medications on File Prior to Visit  Medication Sig Dispense Refill   baclofen (LIORESAL) 10 MG tablet Take 0.5-1 tablets (5-10 mg total) by mouth 3 (three) times daily as needed for muscle spasms. 30 each 3   buPROPion (WELLBUTRIN SR) 150 MG 12 hr tablet Take 1 tablet (150 mg total) by mouth 2 (two) times daily. 180 tablet 0   busPIRone (BUSPAR) 15 MG tablet Take 1 tablet (15 mg total) by mouth 2 (two) times daily. 180 tablet 1   calcium citrate-vitamin D (CITRACAL+D) 315-200 MG-UNIT tablet Take 1 tablet by mouth 2 (two) times daily.     esomeprazole (NEXIUM) 40 MG capsule Take 1 capsule (40 mg total) by mouth daily. Take 1 capsule by mouth once daily 90 capsule 3   estradiol (ESTRACE VAGINAL) 0.1 MG/GM vaginal cream Place 1 Applicatorful vaginally daily. For 2 weeks then 3x/ week 42.5 g 2   etodolac (LODINE) 400 MG tablet Take 1 tablet (400 mg total) by mouth 2 (two) times daily as needed. 60 tablet 3    fludrocortisone (FLORINEF) 0.1 MG tablet Take 1 tablet (0.1 mg total) by mouth daily. Please schedule appointment for further refills 90 tablet 3   hydrOXYzine (VISTARIL) 25 MG capsule TAKE 1 CAPSULE BY MOUTH AT BEDTIME AS NEEDED FOR ANXIETY 30 capsule 0   IRON PO Take 65 mg by mouth daily.     levothyroxine (SYNTHROID) 75 MCG tablet Take 1 tablet (75 mcg total) by mouth daily before breakfast. 90 tablet 1   methocarbamol (ROBAXIN) 500 MG tablet TAKE 1 TABLET BY MOUTH EVERY 8 HOURS AS NEEDED FOR MUSCLE SPASM 30 tablet 0   midodrine (PROAMATINE) 5 MG tablet Take 1 tablet (5 mg total) by mouth 2 (two) times daily with a meal. 180 tablet 3   Multiple Vitamins-Minerals (WOMENS MULTI VITAMIN & MINERAL PO) Take 1 tablet by mouth daily.     traZODone (DESYREL) 50 MG tablet Take 0.5-1 tablets (25-50 mg total) by mouth at bedtime as needed for sleep. 90 tablet 0   Allergies Allergies  Allergen Reactions   Penicillins    Pollen Extract Other (See Comments)    Stuffy nose   Sulfa Antibiotics     Review of Systems: Constitutional:  no unexpected weight changes Eye:  no recent significant change in vision Ear/Nose/Mouth/Throat:  Ears:  no recent change in hearing Nose/Mouth/Throat:  no complaints of nasal congestion, no  sore throat Cardiovascular: no chest pain Respiratory:  no shortness of breath Gastrointestinal:  no abdominal pain, no change in bowel habits GU:  Female: negative for dysuria or pelvic pain Musculoskeletal/Extremities:  no new pain of the joints Integumentary (Skin/Breast):  no abnormal skin lesions reported Neurologic:  no headaches Endocrine:  denies fatigue  Exam BP 104/62    Pulse 79    Temp 98.1 F (36.7 C) (Oral)    Ht 5\' 4"  (1.626 m)    Wt 145 lb 8 oz (66 kg)    LMP 12/16/2015    SpO2 97%    BMI 24.98 kg/m  General:  well developed, well nourished, in no apparent distress Skin:  no significant moles, warts, or growths Head:  no masses, lesions, or tenderness Eyes:   pupils equal and round, sclera anicteric without injection Ears:  canals without lesions, TMs shiny without retraction, no obvious effusion, no erythema Nose:  nares patent, septum midline, mucosa normal, and no drainage or sinus tenderness Throat/Pharynx:  lips and gingiva without lesion; tongue and uvula midline; non-inflamed pharynx; no exudates or postnasal drainage Neck: neck supple without adenopathy, thyromegaly, or masses Lungs:  clear to auscultation, breath sounds equal bilaterally, no respiratory distress Cardio:  regular rate and rhythm, no LE edema Abdomen:  abdomen soft, nontender; bowel sounds normal; no masses or organomegaly Genital: Defer to GYN Musculoskeletal:  symmetrical muscle groups noted without atrophy or deformity Extremities:  no clubbing, cyanosis, or edema, no deformities, no skin discoloration Neuro:  gait normal; deep tendon reflexes normal and symmetric Psych: well oriented with normal range of affect and appropriate judgment/insight  Assessment and Plan  Well adult exam - Plan: CBC, Comprehensive metabolic panel, Lipid panel, VITAMIN D 25 Hydroxy (Vit-D Deficiency, Fractures)  Screening breast examination - Plan: MM DIGITAL SCREENING BILATERAL  Hypothyroidism due to acquired atrophy of thyroid - Plan: TSH   Well 56 y.o. female. Counseled on diet and exercise. Bivalent COVID vaccination booster recommended.  Mammogram ordered.  Advanced directive form provided today.  Other orders as above. Follow up in 6 mo. The patient voiced understanding and agreement to the plan.  [address] [place], DO 01/29/21 2:50 PM

## 2021-01-29 NOTE — Patient Instructions (Addendum)
Give [place] 2-3 business days to get the results of your labs back.   Keep the diet clean and stay active.  Someone will reach out in the next few days regarding your mammogram.   I recommend getting the updated bivalent covid vaccination booster at your convenience.   Let us know if you need anything.

## 2021-01-30 LAB — COMPREHENSIVE METABOLIC PANEL
ALT: 28 U/L (ref 0–35)
AST: 26 U/L (ref 0–37)
Albumin: 4.3 g/dL (ref 3.5–5.2)
Alkaline Phosphatase: 86 U/L (ref 39–117)
BUN: 11 mg/dL (ref 6–23)
CO2: 32 mEq/L (ref 19–32)
Calcium: 9.7 mg/dL (ref 8.4–10.5)
Chloride: 104 mEq/L (ref 96–112)
Creatinine, Ser: 0.83 mg/dL (ref 0.40–1.20)
GFR: 79.72 mL/min (ref >60.00)
Glucose, Bld: 69 mg/dL — ABNORMAL LOW (ref 70–99)
Potassium: 4.5 mEq/L (ref 3.5–5.1)
Sodium: 142 mEq/L (ref 135–145)
Total Bilirubin: 0.4 mg/dL (ref 0.2–1.2)
Total Protein: 6.7 g/dL (ref 6.0–8.3)

## 2021-01-30 LAB — VITAMIN D 25 HYDROXY (VIT D DEFICIENCY, FRACTURES): VITD: 53.17 ng/mL (ref 30.00–100.00)

## 2021-01-30 LAB — CBC
HCT: 38.8 % (ref 36.0–46.0)
Hemoglobin: 12.6 g/dL (ref 12.0–15.0)
MCHC: 32.4 g/dL (ref 30.0–36.0)
MCV: 93.4 fl (ref 78.0–100.0)
Platelets: 280 10*3/uL (ref 150.0–400.0)
RBC: 4.15 Mil/uL (ref 3.87–5.11)
RDW: 13.7 % (ref 11.5–15.5)
WBC: 6 10*3/uL (ref 4.0–10.5)

## 2021-01-30 LAB — LIPID PANEL
Cholesterol: 164 mg/dL (ref 0–200)
HDL: 58.6 mg/dL (ref >39.00)
LDL Cholesterol: 87 mg/dL (ref 0–99)
NonHDL: 105.78
Total CHOL/HDL Ratio: 3
Triglycerides: 92 mg/dL (ref 0.0–149.0)
VLDL: 18.4 mg/dL (ref 0.0–40.0)

## 2021-01-30 LAB — TSH: TSH: 3.16 u[IU]/mL (ref 0.35–5.50)

## 2021-02-19 ENCOUNTER — Ambulatory Visit (HOSPITAL_BASED_OUTPATIENT_CLINIC_OR_DEPARTMENT_OTHER)
Admission: RE | Admit: 2021-02-19 | Discharge: 2021-02-19 | Disposition: A | Discharge status: Home / Self Care | Payer: Managed Care, Other (non HMO) | Source: Ambulatory Visit | Attending: Family Medicine | Admitting: Family Medicine

## 2021-02-19 ENCOUNTER — Other Ambulatory Visit: Payer: Self-pay

## 2021-02-19 ENCOUNTER — Encounter (HOSPITAL_BASED_OUTPATIENT_CLINIC_OR_DEPARTMENT_OTHER): Payer: Self-pay

## 2021-02-19 DIAGNOSIS — Z1239 Encounter for other screening for malignant neoplasm of breast: Secondary | ICD-10-CM

## 2021-02-19 DIAGNOSIS — Z1231 Encounter for screening mammogram for malignant neoplasm of breast: Secondary | ICD-10-CM | POA: Insufficient documentation

## 2021-02-26 ENCOUNTER — Encounter: Payer: Self-pay | Admitting: Family Medicine

## 2021-02-26 DIAGNOSIS — F329 Major depressive disorder, single episode, unspecified: Secondary | ICD-10-CM

## 2021-02-26 MED ORDER — BUPROPION HCL ER (SR) 150 MG PO TB12: 150.0000 mg | ORAL_TABLET | Freq: Two times a day (BID) | ORAL | 0 refills | Status: DC

## 2021-03-05 ENCOUNTER — Encounter: Payer: Self-pay | Admitting: Family Medicine

## 2021-04-02 ENCOUNTER — Encounter: Payer: Self-pay | Admitting: Family Medicine

## 2021-04-02 ENCOUNTER — Other Ambulatory Visit: Payer: Self-pay | Admitting: Family Medicine

## 2021-04-02 MED ORDER — TRAZODONE HCL 50 MG PO TABS: 25.0000 mg | ORAL_TABLET | Freq: Every evening | ORAL | 2 refills | Status: DC | PRN

## 2021-05-07 ENCOUNTER — Other Ambulatory Visit: Payer: Self-pay | Admitting: Family Medicine

## 2021-05-07 DIAGNOSIS — F329 Major depressive disorder, single episode, unspecified: Secondary | ICD-10-CM

## 2021-05-07 MED ORDER — BUPROPION HCL ER (SR) 150 MG PO TB12: 150.0000 mg | ORAL_TABLET | Freq: Two times a day (BID) | ORAL | 2 refills | Status: DC

## 2021-05-08 ENCOUNTER — Telehealth: Payer: Self-pay | Admitting: Family Medicine

## 2021-05-08 ENCOUNTER — Other Ambulatory Visit: Payer: Self-pay

## 2021-05-08 ENCOUNTER — Telehealth: Payer: Self-pay | Admitting: Cardiology

## 2021-05-08 DIAGNOSIS — F329 Major depressive disorder, single episode, unspecified: Secondary | ICD-10-CM

## 2021-05-08 MED ORDER — MIDODRINE HCL 5 MG PO TABS: 5.0000 mg | ORAL_TABLET | Freq: Two times a day (BID) | ORAL | 0 refills | Status: DC

## 2021-05-08 MED ORDER — BUPROPION HCL ER (SR) 150 MG PO TB12: 150.0000 mg | ORAL_TABLET | Freq: Two times a day (BID) | ORAL | 2 refills | Status: DC

## 2021-05-08 NOTE — Telephone Encounter (Signed)
Pt Called LVM to call office back  ?

## 2021-05-08 NOTE — Telephone Encounter (Signed)
?*  STAT* If patient is at the pharmacy, call can be transferred to refill team. ? ? ?1. Which medications need to be refilled? (please list name of each medication and dose if known) midodrine (PROAMATINE) 5 MG tablet ? ?2. Which pharmacy/location (including street and city if local pharmacy) is medication to be sent to? [id number] - [place], [place] - [address] ? ?3. Do they need a 30 day or 90 day supply? 7 day supply, an emergency refill.   ?

## 2021-05-08 NOTE — Telephone Encounter (Signed)
Ron from [facility] called on behalf of the pt stating that she needed an emergency refill for her buPROPion. After reviewing her chart, informed the rep that she had that refilled with [facility] on 4.19.23 and it should be delivered to her home. After confirming this information, the rep states hat she could not wait for the prescription to be delivered and needed it sent to the [facility] on [address] in [place]. ? ?Medication:  ? ?buPROPion (WELLBUTRIN SR) 150 MG 12 hr tablet [[id number]]  ? ?Has the patient contacted their pharmacy? No. ?(If no, request that the patient contact the pharmacy for the refill.) ?(If yes, when and what did the pharmacy advise?) ? ?Preferred Pharmacy (with phone number or street name):  ? ?[facility] ?[address] [place], [place] [address] ?[phone] ? ?Agent: Please be advised that RX refills may take up to 3 business days. We ask that you follow-up with your pharmacy. ? ?Please Advise. ?

## 2021-05-09 MED ORDER — BUPROPION HCL ER (SR) 150 MG PO TB12: 150.0000 mg | ORAL_TABLET | Freq: Two times a day (BID) | ORAL | 0 refills | Status: DC

## 2021-05-09 NOTE — Telephone Encounter (Signed)
Sent in to [facility] a 30 day supply ?Called the patient informed sent in ?

## 2021-05-12 ENCOUNTER — Telehealth: Payer: Self-pay | Admitting: Cardiology

## 2021-05-12 MED ORDER — FLUDROCORTISONE ACETATE 0.1 MG PO TABS: 0.1000 mg | ORAL_TABLET | Freq: Every day | ORAL | 1 refills | Status: DC

## 2021-05-12 NOTE — Telephone Encounter (Signed)
?*  STAT* If patient is at the pharmacy, call can be transferred to refill team. ? ? ?1. Which medications need to be refilled? (please list name of each medication and dose if known) new prescription for Fludrocortisone ? ?2. Which pharmacy/location (including street and city if local pharmacy) is medication to be sent to?[facility] ? ?3. Do they need a 30 day or 90 day supply? 90 days and refills ? ?

## 2021-05-12 NOTE — Telephone Encounter (Signed)
Refill of Fludrocortisone 0.1 mg sent to [facility]. ?

## 2021-06-19 ENCOUNTER — Other Ambulatory Visit: Payer: Self-pay | Admitting: Family Medicine

## 2021-08-01 ENCOUNTER — Ambulatory Visit: Payer: Managed Care, Other (non HMO) | Admitting: Family Medicine

## 2021-08-18 ENCOUNTER — Telehealth: Payer: Self-pay

## 2021-08-18 NOTE — Telephone Encounter (Signed)
Nurse Assessment Nurse: Ahorlu, RN, Lajuan Lines Date/Time (Eastern Time): 08/16/2021 10:34:47 PM Confirm and document reason for call. If symptomatic, describe symptoms. ---Caller states that his wife has ran out of her Wellbutrin 150mg  one tablet bid last dose this morning all medication received by mail usually wants one week rx sent to [facility] [phone]. denies new or worsening symptoms Does the patient have any new or worsening symptoms? ---No Nurse: Ahorlu, RN, [phone] Date/Time (Eastern Time): 08/16/2021 11:10:04 PM Please select the assessment type ---Verbal order / New medication order Does the client directives allow for assistance with medications after hours? ---Yes Other current medications? ---Unknown Medication allergies? ---No Pharmacy name and phone number. ---[facility] [phone] Does the client directive allow for RN to call in the medication order to the pharmacy? ---Yes Disp. Time [phone] Time) Disposition Final User 08/16/2021 10:45:17 PM Paged On Call back to Call Center Ahorlu, RN, 08/18/2021 PLEASE NOTE: All timestamps contained within this report are represented as Lajuan Lines Standard Time. CONFIDENTIALTY NOTICE: This fax transmission is intended only for the addressee. It contains information that is legally privileged, confidential or otherwise protected from use or disclosure. If you are not the intended recipient, you are strictly prohibited from reviewing, disclosing, copying using or disseminating any of this information or taking any action in reliance on or regarding this information. If you have received this fax in error, please notify [place] immediately by telephone so that we can arrange for its return to [place]. Phone: [phone], Toll-Free: [phone], Fax: [phone] Page: 2 of 4 Call Id: [phone] Disp. Time [id number] Time) Disposition Final User 08/16/2021 10:46:38 PM Pharmacy Call Ahorlu, RN, 08/18/2021 Reason: patient needs new rx [facility] to  [facility] [address] [place] [facility] phone [phone] 08/16/2021 10:46:48 PM Clinical Call Yes Ahorlu, RN, 08/18/2021 08/16/2021 10:46:58 PM Paged On Call back to [facility], RN, [facility] 08/16/2021 11:06:15 PM Called On-Call Provider Ahorlu, RN, 08/18/2021 08/16/2021 11:11:23 PM Pharmacy Call Ahorlu, RN, 08/18/2021 Reason: verbal order called into pharmacy Final Disposition 08/16/2021 10:46:48 PM Clinical Call Yes Ahorlu, RN, 08/18/2021 Understands Yes Verbal Orders/Maintenance Medications Medication Refill Route Dosage Regime Duration Admin Instructions User Name wellbutrin Oral 150 mg BID 5 Days please take one tableet twice daily Ahorlu, RN, Lutricia Horsfall Comments User: Lajuan Lines, RN Date/Time (Eastern Time): 08/16/2021 10:44:08 PM could not call in maintenance dose since rx is by mail order need new rx from md for medication User: 08/18/2021, RN Date/Time Tor Netters Time): 08/16/2021 11:13:01 PM called patient's spouse to inform verbal order called in after hours please call pharmacy tomorrow for pick up verbalized understanding Paging [facility] Phone DateTime Result/ Outcome Message Type Notes [facility] - MD Kerby Nora 08/16/2021 10:45:17 PM Paged On Call Back to Call Center Doctor Paged please contact 08/18/2021 with Alvin Critchley regarding patient of Dr Dairl Ponder thank you Marlene Bast - MD Kerby Nora 08/16/2021 11:06:15 PM Called On Call Provider - Reached Doctor Paged 08/18/2021 - MD 08/16/2021 11:06:49 PM Spoke with On Call - General Message Result received verbal order for wellbutrin [facility]

## 2021-09-03 ENCOUNTER — Encounter: Payer: Self-pay | Admitting: Family Medicine

## 2021-10-15 ENCOUNTER — Encounter: Payer: Self-pay | Admitting: Family Medicine

## 2021-10-15 DIAGNOSIS — F329 Major depressive disorder, single episode, unspecified: Secondary | ICD-10-CM

## 2021-10-15 MED ORDER — BUSPIRONE HCL 15 MG PO TABS: 15.0000 mg | ORAL_TABLET | Freq: Two times a day (BID) | ORAL | 1 refills | Status: DC

## 2021-10-27 ENCOUNTER — Other Ambulatory Visit: Payer: Self-pay | Admitting: Cardiology

## 2021-11-05 ENCOUNTER — Telehealth: Payer: Self-pay | Admitting: Cardiology

## 2021-11-05 NOTE — Telephone Encounter (Signed)
*  STAT* If patient is at the pharmacy, call can be transferred to refill team.   1. Which medications need to be refilled? (please list name of each medication and dose if known)   fludrocortisone (FLORINEF) 0.1 MG tablet    midodrine (PROAMATINE) 5 MG tablet    2. Which pharmacy/location (including street and city if local pharmacy) is medication to be sent to?  [place], [place] Phone:  [phone]        3. Do they need a 30 day or 90 day supply?  90 day

## 2021-11-11 ENCOUNTER — Other Ambulatory Visit: Payer: Self-pay

## 2021-11-11 MED ORDER — FLUDROCORTISONE ACETATE 0.1 MG PO TABS: 0.1000 mg | ORAL_TABLET | Freq: Every day | ORAL | 0 refills | Status: DC

## 2021-11-11 MED ORDER — MIDODRINE HCL 5 MG PO TABS: 5.0000 mg | ORAL_TABLET | Freq: Two times a day (BID) | ORAL | 0 refills | Status: DC

## 2021-11-11 NOTE — Telephone Encounter (Signed)
Refills sent to [facility] for 90 day supply with message for pharmacy and patient to keep her scheduled appointment 01/20/2022 for future refills / final attempt

## 2021-11-17 ENCOUNTER — Encounter: Payer: Self-pay | Admitting: Family Medicine

## 2021-11-17 ENCOUNTER — Other Ambulatory Visit: Payer: Self-pay | Admitting: Family Medicine

## 2021-11-17 MED ORDER — TRAZODONE HCL 50 MG PO TABS: 25.0000 mg | ORAL_TABLET | Freq: Every evening | ORAL | 2 refills | Status: DC | PRN

## 2021-11-25 ENCOUNTER — Other Ambulatory Visit: Payer: Self-pay | Admitting: Family Medicine

## 2021-12-01 MED ORDER — TRAZODONE HCL 50 MG PO TABS: 25.0000 mg | ORAL_TABLET | Freq: Every evening | ORAL | 2 refills | Status: DC | PRN

## 2021-12-02 ENCOUNTER — Encounter: Payer: Self-pay | Admitting: General Practice

## 2021-12-29 ENCOUNTER — Encounter: Payer: Self-pay | Admitting: Family Medicine

## 2021-12-30 ENCOUNTER — Telehealth (INDEPENDENT_AMBULATORY_CARE_PROVIDER_SITE_OTHER): Payer: Managed Care, Other (non HMO) | Admitting: Family Medicine

## 2021-12-30 ENCOUNTER — Encounter: Payer: Self-pay | Admitting: Family Medicine

## 2021-12-30 DIAGNOSIS — F331 Major depressive disorder, recurrent, moderate: Secondary | ICD-10-CM | POA: Diagnosis not present

## 2021-12-30 DIAGNOSIS — J302 Other seasonal allergic rhinitis: Secondary | ICD-10-CM

## 2021-12-30 MED ORDER — DULOXETINE HCL 20 MG PO CPEP: 20.0000 mg | ORAL_CAPSULE | Freq: Every day | ORAL | 3 refills | Status: DC

## 2021-12-30 MED ORDER — PREDNISONE 20 MG PO TABS: 40.0000 mg | ORAL_TABLET | Freq: Every day | ORAL | 0 refills | Status: AC

## 2021-12-30 NOTE — Progress Notes (Signed)
Chief Complaint  Patient presents with   Cough    Congestion    Anxiety   Depression    Nigel Sloop here for URI complaints. Due to COVID-19 pandemic, we are interacting via web portal for an electronic face-to-face visit. I verified patient's ID using 2 identifiers. Patient agreed to proceed with visit via this method. Patient is at home, I am at office. Patient and I are present for visit.   Duration: 4 weeks  Associated symptoms: sinus congestion, rhinorrhea, itchy watery eyes, myalgia, and coughing, sneezing Denies: sinus pain, ear pain, ear drainage, sore throat, wheezing, shortness of breath, and fevers Treatment to date: Nyquil, Dayquil, Tylenol, Mucinex Sick contacts: No  Patient has a history of depression.  She has been sad, lacking interest in doing things, and having a depressed mood/tearfulness.  She is frustrated with her lack of interaction with other people.  She works at home so does not get any interaction with her job.  She does not have many hobbies.  She recently started going to church again.  She does follow with a Veterinary surgeon.  She does not exercise routinely but has joined a gym, unfortunately does not have group classes.  She states that her marriage is not as ideal due to chronic back issues with her husband.  She is compliant with BuSpar 15 mg twice daily, Wellbutrin 150 mg twice daily.  It was initially helpful but not so much lately.  Past Medical History:  Diagnosis Date   Arthritis    Frequent headaches    GERD (gastroesophageal reflux disease)    High cholesterol    HPV (human papilloma virus) infection    Sleep apnea    Urine incontinence     Objective No conversational dyspnea Age appropriate judgment and insight Nml affect and mood  Seasonal allergic rhinitis, unspecified trigger - Plan: predniSONE (DELTASONE) 20 MG tablet  Moderate episode of recurrent major depressive disorder (HCC) - Plan: DULoxetine (CYMBALTA) 20 MG capsule  5 d  pred burst 40 mg/d. Send message in 2-3 d if no better. Continue to push fluids, practice good hand hygiene, cover mouth when coughing. Chronic, uncontrolled. Stop Wellbutrin, start Cymbalta. Has failed Zoloft and Lexapro prior.  We discussed options to meet new people.  Counseled on exercise. F/u in 1 mo.  Pt voiced understanding and agreement to the plan.  Jilda Roche [place], DO 12/30/21 9:49 AM

## 2022-01-05 ENCOUNTER — Other Ambulatory Visit: Payer: Self-pay | Admitting: Family Medicine

## 2022-01-05 ENCOUNTER — Encounter: Payer: Self-pay | Admitting: Family Medicine

## 2022-01-05 MED ORDER — AZITHROMYCIN 250 MG PO TABS: ORAL_TABLET | ORAL | 0 refills | Status: DC

## 2022-01-11 ENCOUNTER — Encounter: Payer: Self-pay | Admitting: Family Medicine

## 2022-01-13 MED ORDER — TRAZODONE HCL 50 MG PO TABS: 25.0000 mg | ORAL_TABLET | Freq: Every evening | ORAL | 2 refills | Status: DC | PRN

## 2022-01-13 NOTE — Addendum Note (Signed)
Addended by: Maximino Sarin on: 01/13/2022 11:51 AM   Modules accepted: Orders

## 2022-01-18 NOTE — Progress Notes (Unsigned)
Cardiology Office Note:    Date:  01/18/2022   ID:  Lindsay, Neal Oct 25, 1965, MRN [id number]  PCP:  Sharlene Dory, DO  Cardiologist:  Norman Herrlich, MD    Referring MD: Sharlene Dory*    ASSESSMENT:    No diagnosis found. PLAN:    In order of problems listed above:  ***   Next appointment: ***   Medication Adjustments/Labs and Tests Ordered: Current medicines are reviewed at length with the patient today.  Concerns regarding medicines are outlined above.  No orders of the defined types were placed in this encounter.  No orders of the defined types were placed in this encounter.   No chief complaint on file.   History of Present Illness:    Lindsay Neal is a 56 y.o. female with a hx of obesity and gastric bypass surgery in 2016 and symptomatic hypotension last seen 12/27/2020. Compliance with diet, lifestyle and medications: *** Past Medical History:  Diagnosis Date   Arthritis    Frequent headaches    GERD (gastroesophageal reflux disease)    High cholesterol    HPV (human papilloma virus) infection    Sleep apnea    Urine incontinence     Past Surgical History:  Procedure Laterality Date   CESAREAN SECTION  04/13/1991   GALLBLADDER SURGERY  10/08/2014   GASTRIC BYPASS  10/08/2014   PLANTAR FASCIA SURGERY  2015   SHOULDER SURGERY Right 2014   VAGINA SURGERY     vaginal tear repair    Current Medications: No outpatient medications have been marked as taking for the 01/20/22 encounter (Appointment) with Baldo Daub, MD.     Allergies:   Penicillins, Pollen extract, and Sulfa antibiotics   Social History   Socioeconomic History   Marital status: Married    Spouse name: Not on file   Number of children: Not on file   Years of education: Not on file   Highest education level: Not on file  Occupational History   Not on file  Tobacco Use   Smoking status: Never   Smokeless tobacco: Never  Vaping Use    Vaping Use: Never used  Substance and Sexual Activity   Alcohol use: No   Drug use: Never   Sexual activity: Yes    Partners: Male  Other Topics Concern   Not on file  Social History Narrative   Not on file   Social Determinants of Health   Financial Resource Strain: Not on file  Food Insecurity: Not on file  Transportation Needs: Not on file  Physical Activity: Not on file  Stress: Not on file  Social Connections: Not on file     Family History: The patient's ***family history includes Arthritis in her mother and paternal grandmother; Colon cancer in her maternal grandmother; Diabetes in her maternal grandmother and mother; Elevated Lipids in her father, maternal grandfather, and mother; Heart disease in her brother and father; Hypertension in her father and paternal grandmother; Stroke in her father. There is no history of Stomach cancer, Pancreatic cancer, or Esophageal cancer. ROS:   Please see the history of present illness.    All other systems reviewed and are negative.  EKGs/Labs/Other Studies Reviewed:    The following studies were reviewed today:  EKG:  EKG ordered today and personally reviewed.  The ekg ordered today demonstrates *** Cardiac CTA 04/26/2020: IMPRESSION: 1. Coronary calcium score of 0.4. This was 76th percentile for age, sex, and race matched control.  2. Normal coronary origin with Right dominance.   3. CAD-RADS 1. Minimal non-obstructive CAD (1-24%). Consider non-atherosclerotic causes of chest pain. Consider preventive therapy and risk factor modification.   4. Ascending aortic atherosclerosis.   Event monitor 04/17/2020: Study Highlights     Patch Wear Time:  7 days and 0 hours (2022-03-30T16:45:52-0400 to 2022-04-06T17:13:07-398)   Patient had a min HR of 56 bpm, max HR of 134 bpm, and avg HR of 82 bpm. Predominant underlying rhythm was Sinus Rhythm. Isolated SVEs were rare (<1.0%), SVE Couplets were rare (<1.0%), and SVE Triplets  were rare (<1.0%). Isolated VEs were rare (<1.0%), and no VE Couplets or VE Triplets were present.    There were 9 triggered headache diary events all associated with sinus rhythm rate 68 106 bpm. There were no pauses of 3 seconds or greater and no episodes of second or third-degree AV nodal block or sinus node exit block. Ventricular ectopy was rare. Supraventricular ectopy was rare no episodes of atrial fibrillation flutter or SVT.   Conclusion normal event monitor. Recent Labs: 01/29/2021: ALT 28; BUN 11; Creatinine, Ser 0.83; Hemoglobin 12.6; Platelets 280.0; Potassium 4.5; Sodium 142; TSH 3.16  Recent Lipid Panel    Component Value Date/Time   CHOL 164 01/29/2021 1451   TRIG 92.0 01/29/2021 1451   HDL 58.60 01/29/2021 1451   CHOLHDL 3 01/29/2021 1451   VLDL 18.4 01/29/2021 1451   LDLCALC 87 01/29/2021 1451   LDLCALC 77 10/12/2019 0933    Physical Exam:    VS:  LMP 12/16/2015     Wt Readings from Last 3 Encounters:  01/29/21 145 lb 8 oz (66 kg)  12/27/20 145 lb 9.6 oz (66 kg)  12/25/20 147 lb (66.7 kg)     GEN: *** Well nourished, well developed in no acute distress HEENT: Normal NECK: No JVD; No carotid bruits LYMPHATICS: No lymphadenopathy CARDIAC: ***RRR, no murmurs, rubs, gallops RESPIRATORY:  Clear to auscultation without rales, wheezing or rhonchi  ABDOMEN: Soft, non-tender, non-distended MUSCULOSKELETAL:  No edema; No deformity  SKIN: Warm and dry NEUROLOGIC:  Alert and oriented x 3 PSYCHIATRIC:  Normal affect    Signed, Norman Herrlich, MD  01/18/2022 3:13 PM    [facility]

## 2022-01-20 ENCOUNTER — Encounter: Payer: Self-pay | Admitting: Cardiology

## 2022-01-20 ENCOUNTER — Ambulatory Visit: Payer: Managed Care, Other (non HMO) | Attending: Cardiology | Admitting: Cardiology

## 2022-01-20 VITALS — BP 124/82 | HR 70 | Ht 64.0 in | Wt 129.0 lb

## 2022-01-20 DIAGNOSIS — I951 Orthostatic hypotension: Secondary | ICD-10-CM | POA: Diagnosis not present

## 2022-01-20 DIAGNOSIS — E034 Atrophy of thyroid (acquired): Secondary | ICD-10-CM

## 2022-01-20 MED ORDER — MIDODRINE HCL 5 MG PO TABS: 5.0000 mg | ORAL_TABLET | Freq: Two times a day (BID) | ORAL | 3 refills | Status: DC

## 2022-01-20 MED ORDER — FLUDROCORTISONE ACETATE 0.1 MG PO TABS: 0.1000 mg | ORAL_TABLET | Freq: Every day | ORAL | 3 refills | Status: DC

## 2022-01-20 NOTE — Addendum Note (Signed)
Addended by: Edwyna Shell I on: 01/20/2022 04:37 PM   Modules accepted: Orders

## 2022-01-20 NOTE — Patient Instructions (Signed)
Medication Instructions:  Your physician recommends that you continue on your current medications as directed. Please refer to the Current Medication list given to you today.  *If you need a refill on your cardiac medications before your next appointment, please call your pharmacy*   Lab Work: None If you have labs (blood work) drawn today and your tests are completely normal, you will receive your results only by: MyChart Message (if you have MyChart) OR A paper copy in the mail If you have any lab test that is abnormal or we need to change your treatment, we will call you to review the results.   Testing/Procedures: None   Follow-Up: At [facility], you and your health needs are our priority.  As part of our continuing mission to provide you with exceptional heart care, we have created designated Provider Care Teams.  These Care Teams include your primary Cardiologist (physician) and Advanced Practice Providers (APPs -  Physician Assistants and Nurse Practitioners) who all work together to provide you with the care you need, when you need it.  We recommend signing up for the patient portal called "MyChart".  Sign up information is provided on this After Visit Summary.  MyChart is used to connect with patients for Virtual Visits (Telemedicine).  Patients are able to view lab/test results, encounter notes, upcoming appointments, etc.  Non-urgent messages can be sent to your provider as well.   To learn more about what you can do with MyChart, go to [email or web address].    Your next appointment:   1 year(s)  The format for your next appointment:   In Person  Provider:   Brian Munley, MD    Other Instructions None  Important Information About Sugar       

## 2022-01-21 ENCOUNTER — Encounter: Payer: Self-pay | Admitting: Family Medicine

## 2022-01-21 ENCOUNTER — Ambulatory Visit (INDEPENDENT_AMBULATORY_CARE_PROVIDER_SITE_OTHER): Payer: Managed Care, Other (non HMO) | Admitting: Family Medicine

## 2022-01-21 VITALS — BP 114/70 | HR 77 | Temp 98.5°F | Ht 64.0 in | Wt 129.2 lb

## 2022-01-21 DIAGNOSIS — S8002XA Contusion of left knee, initial encounter: Secondary | ICD-10-CM

## 2022-01-21 DIAGNOSIS — F329 Major depressive disorder, single episode, unspecified: Secondary | ICD-10-CM

## 2022-01-21 DIAGNOSIS — R0781 Pleurodynia: Secondary | ICD-10-CM | POA: Diagnosis not present

## 2022-01-21 MED ORDER — BUSPIRONE HCL 15 MG PO TABS: 15.0000 mg | ORAL_TABLET | Freq: Two times a day (BID) | ORAL | 0 refills | Status: DC

## 2022-01-21 NOTE — Progress Notes (Signed)
Musculoskeletal Exam  Patient: Lindsay Neal DOB: 1965/03/27  DOS: 01/21/2022  SUBJECTIVE:  Chief Complaint:   Chief Complaint  Patient presents with   Fall    Left Knee pain Left rib pain    Lindsay Neal is a 57 y.o.  female for evaluation and treatment of L knee pain.   Onset:  3 days ago. Fell on L knee.  Location: outer L knee cap Character:  aching  Progression of issue:  has improved Associated symptoms: redness initially No bruising or swelling Walking OK.  Treatment: to date has been ice.   Neurovascular symptoms: no  Over the past several months, the patient will intermittently left rib pain over the lower rib cage.  She was cleaning the bathtub around 1 week ago and felt a pop.  She feels slight swelling of the area but denies any bruising or redness.  She is able to move extremities without issue.  She has been using Tylenol without significant improvement.  No neurologic signs or symptoms.  Past Medical History:  Diagnosis Date   Arthritis    Frequent headaches    GERD (gastroesophageal reflux disease)    High cholesterol    HPV (human papilloma virus) infection    Sleep apnea    Urine incontinence     Objective: VITAL SIGNS: BP 114/70 (BP Location: Left Arm, Patient Position: Sitting, Cuff Size: Normal)   Pulse 77   Temp 98.5 F (36.9 C) (Oral)   Ht 5\' 4"  (1.626 m)   Wt 129 lb 4 oz (58.6 kg)   LMP 12/16/2015   SpO2 97%   BMI 22.19 kg/m  Constitutional: Well formed, well developed. No acute distress. Thorax & Lungs: No accessory muscle use Musculoskeletal: Knee.   Normal active range of motion: yes.   Normal passive range of motion: yes Tenderness to palpation: mild ttp over lateral L kneecap Deformity: no Ecchymosis: no Mild TTP over the left lower rib cage anteriorly with slight prominence compared to the right.  No significant edema or ecchymosis. Neurologic: Normal sensory function. Psychiatric: Normal mood. Age appropriate  judgment and insight. Alert & oriented x 3.    Assessment:  Rib pain  Contusion of left knee, initial encounter  Plan: Stretches/exercises, heat, ice, Tylenol.  It is not bothersome enough for that physical therapy but that would be the next step.  She will let me know if this changes. This is steadily improving.  She is not significantly tender on exam and there is no deformity.  Would hold off on getting an x-ray given her improvement.  She will let me know if anything changes. F/u as originally scheduled. The patient voiced understanding and agreement to the plan.   Man, DO 01/21/22  3:33 PM

## 2022-01-21 NOTE — Patient Instructions (Addendum)
Ice/cold pack over area for 10-15 min twice daily.  OK to take Tylenol 1000 mg (2 extra strength tabs) or 975 mg (3 regular strength tabs) every 6 hours as needed.  Ibuprofen 400-600 mg (2-3 over the counter strength tabs) every 6 hours as needed for pain.  If the rib becomes more bothersome, please let me know and we can get you set up with physical therapy.   Let us know if you need anything.  Mid-Back Strain Rehab It is normal to feel mild stretching, pulling, tightness, or discomfort as you do these exercises, but you should stop right away if you feel sudden pain or your pain gets worse.  Stretching and range of motion exercises This exercise warms up your muscles and joints and improves the movement and flexibility of your back and shoulders. This exercise also help to relieve pain. Exercise A: Chest and spine stretch  Lie down on your back on a firm surface. Roll a towel or a small blanket so it is about 4 inches (10 cm) in diameter. Put the towel lengthwise under the middle of your back so it is under your spine, but not under your shoulder blades. To increase the stretch, you may put your hands behind your head and let your elbows fall to your sides. Hold for 30 seconds. Repeat exercise 2 times. Complete this exercise 3 times per week. Strengthening exercises These exercises build strength and endurance in your back and your shoulder blade muscles. Endurance is the ability to use your muscles for a long time, even after they get tired. Exercise C: Straight arm rows (shoulder extension) Stand with your feet shoulder width apart. Secure an exercise band to a stable object in front of you so the band is at or above shoulder height. Hold one end of the exercise band in each hand. Straighten your elbows and lift your hands up to shoulder height. Step back, away from the secured end of the exercise band, until the band stretches. Squeeze your shoulder blades together and pull your  hands down to the sides of your thighs. Stop when your hands are straight down by your sides. Do not let your hands go behind your body. Hold for 2 seconds. Slowly return to the starting position. Repeat 2 times. Complete this exercise 3 times per week. Exercise D: Shoulder external rotation, prone Lie on your abdomen on a firm bed so your left / right forearm hangs over the edge of the bed and your upper arm is on the bed, straight out from your body. Your elbow should be bent. Your palm should be facing your feet. If instructed, hold a 2-5 lb weight in your hand. Squeeze your shoulder blade toward the middle of your back. Do not let your shoulder lift toward your ear. Keep your elbow bent in an "L" shape (90 degrees) while you slowly move your forearm up toward the ceiling. Move your forearm up to the height of the bed, toward your head. Your upper arm should not move. At the top of the movement, your palm should face the floor. Hold for 1 second. Slowly return to the starting position and relax your muscles. Repeat 2 times. Complete this exercise 3 times per week. Exercise E: Scapular retraction and external rotation, rowing  Sit in a stable chair without armrests, or stand. Secure an exercise band to a stable object in front of you so it is at shoulder height. Hold one end of the exercise band in each hand.  Bring your arms out straight in front of you. Step back, away from the secured end of the exercise band, until the band stretches. Pull the band backward. As you do this, bend your elbows and squeeze your shoulder blades together, but avoid letting the rest of your body move. Do not let your shoulders lift up toward your ears. Stop when your elbows are at your sides or slightly behind your body. Hold for 1 second1. Slowly straighten your arms to return to the starting position. Repeat 2 times. Complete this exercise 3 times per week. Posture and body mechanics  Body mechanics  refers to the movements and positions of your body while you do your daily activities. Posture is part of body mechanics. Good posture and healthy body mechanics can help to relieve stress in your body's tissues and joints. Good posture means that your spine is in its natural S-curve position (your spine is neutral), your shoulders are pulled back slightly, and your head is not tipped forward. The following are general guidelines for applying improved posture and body mechanics to your everyday activities. Standing  When standing, keep your spine neutral and your feet about hip-width apart. Keep a slight bend in your knees. Your ears, shoulders, and hips should line up. When you do a task in which you lean forward while standing in one place for a long time, place one foot up on a stable object that is 2-4 inches (5-10 cm) high, such as a footstool. This helps keep your spine neutral. Sitting  When sitting, keep your spine neutral and keep your feet flat on the floor. Use a footrest, if necessary, and keep your thighs parallel to the floor. Avoid rounding your shoulders, and avoid tilting your head forward. When working at a desk or a computer, keep your desk at a height where your hands are slightly lower than your elbows. Slide your chair under your desk so you are close enough to maintain good posture. When working at a computer, place your monitor at a height where you are looking straight ahead and you do not have to tilt your head forward or downward to look at the screen. Resting  When lying down and resting, avoid positions that are most painful for you. If you have pain with activities such as sitting, bending, stooping, or squatting (flexion-based activities), lie in a position in which your body does not bend very much. For example, avoid curling up on your side with your arms and knees near your chest (fetal position). If you have pain with activities such as standing for a long time or  reaching with your arms (extension-based activities), lie with your spine in a neutral position and bend your knees slightly. Try the following positions: Lying on your side with a pillow between your knees. Lying on your back with a pillow under your knees.  Lifting  When lifting objects, keep your feet at least shoulder-width apart and tighten your abdominal muscles. Bend your knees and hips and keep your spine neutral. It is important to lift using the strength of your legs, not your back. Do not lock your knees straight out. Always ask for help to lift heavy or awkward objects. Make sure you discuss any questions you have with your health care provider. Document Released: 01/05/2005 Document Revised: 09/12/2015 Document Reviewed: 10/17/2014 Elsevier Interactive Patient Education  [employer].

## 2022-01-27 ENCOUNTER — Other Ambulatory Visit: Payer: Self-pay | Admitting: Family Medicine

## 2022-01-27 DIAGNOSIS — F329 Major depressive disorder, single episode, unspecified: Secondary | ICD-10-CM

## 2022-01-27 DIAGNOSIS — F331 Major depressive disorder, recurrent, moderate: Secondary | ICD-10-CM

## 2022-01-27 MED ORDER — BUSPIRONE HCL 15 MG PO TABS: 15.0000 mg | ORAL_TABLET | Freq: Two times a day (BID) | ORAL | 0 refills | Status: DC

## 2022-01-27 MED ORDER — TRAZODONE HCL 50 MG PO TABS: 25.0000 mg | ORAL_TABLET | Freq: Every evening | ORAL | 0 refills | Status: DC | PRN

## 2022-01-27 MED ORDER — DULOXETINE HCL 20 MG PO CPEP: 20.0000 mg | ORAL_CAPSULE | Freq: Every day | ORAL | 0 refills | Status: DC

## 2022-01-29 ENCOUNTER — Other Ambulatory Visit (HOSPITAL_BASED_OUTPATIENT_CLINIC_OR_DEPARTMENT_OTHER): Payer: Self-pay | Admitting: Family Medicine

## 2022-01-29 ENCOUNTER — Other Ambulatory Visit: Payer: Self-pay | Admitting: Family Medicine

## 2022-01-29 DIAGNOSIS — Z1231 Encounter for screening mammogram for malignant neoplasm of breast: Secondary | ICD-10-CM

## 2022-01-29 DIAGNOSIS — F329 Major depressive disorder, single episode, unspecified: Secondary | ICD-10-CM

## 2022-02-16 ENCOUNTER — Other Ambulatory Visit: Payer: Self-pay | Admitting: Family Medicine

## 2022-02-17 MED ORDER — TRAZODONE HCL 50 MG PO TABS: 25.0000 mg | ORAL_TABLET | Freq: Every evening | ORAL | 0 refills | Status: DC | PRN

## 2022-02-23 ENCOUNTER — Other Ambulatory Visit: Payer: Self-pay | Admitting: Family Medicine

## 2022-03-02 ENCOUNTER — Ambulatory Visit (HOSPITAL_BASED_OUTPATIENT_CLINIC_OR_DEPARTMENT_OTHER): Payer: Managed Care, Other (non HMO)

## 2022-03-10 ENCOUNTER — Ambulatory Visit (HOSPITAL_BASED_OUTPATIENT_CLINIC_OR_DEPARTMENT_OTHER)
Admission: RE | Admit: 2022-03-10 | Discharge: 2022-03-10 | Disposition: A | Discharge status: Home / Self Care | Payer: Managed Care, Other (non HMO) | Source: Ambulatory Visit | Attending: Family Medicine | Admitting: Family Medicine

## 2022-03-10 ENCOUNTER — Encounter (HOSPITAL_BASED_OUTPATIENT_CLINIC_OR_DEPARTMENT_OTHER): Payer: Self-pay

## 2022-03-10 DIAGNOSIS — Z1231 Encounter for screening mammogram for malignant neoplasm of breast: Secondary | ICD-10-CM | POA: Diagnosis not present

## 2022-03-24 ENCOUNTER — Encounter: Payer: Self-pay | Admitting: Family Medicine

## 2022-03-25 ENCOUNTER — Encounter: Payer: Self-pay | Admitting: Family Medicine

## 2022-03-25 ENCOUNTER — Ambulatory Visit: Payer: Managed Care, Other (non HMO) | Admitting: Family Medicine

## 2022-03-25 VITALS — BP 112/80 | HR 68 | Temp 98.5°F | Ht 64.0 in | Wt 131.5 lb

## 2022-03-25 DIAGNOSIS — R58 Hemorrhage, not elsewhere classified: Secondary | ICD-10-CM

## 2022-03-25 DIAGNOSIS — M542 Cervicalgia: Secondary | ICD-10-CM | POA: Diagnosis not present

## 2022-03-25 DIAGNOSIS — J3489 Other specified disorders of nose and nasal sinuses: Secondary | ICD-10-CM

## 2022-03-25 DIAGNOSIS — T148XXA Other injury of unspecified body region, initial encounter: Secondary | ICD-10-CM | POA: Diagnosis not present

## 2022-03-25 DIAGNOSIS — Z23 Encounter for immunization: Secondary | ICD-10-CM

## 2022-03-25 DIAGNOSIS — S0101XA Laceration without foreign body of scalp, initial encounter: Secondary | ICD-10-CM

## 2022-03-25 NOTE — Patient Instructions (Addendum)
Ice/cold pack over area for 10-15 min twice daily.  Heat (pad or rice pillow in microwave) over affected area, 10-15 minutes twice daily.   OK to take Tylenol 1000 mg (2 extra strength tabs) or 975 mg (3 regular strength tabs) every 6 hours as needed.  Let us know if you need anything.  Fish oil 3 grams daily for 10 days then 2 grams daily  Vitamin D 4000 IU daily  CoQ10 '200mg'$  daily for headaches  Tart cherry extract any dose at night  To help improve COGNITIVE function: Using fish oil/omega 3 that is 1000 mg (or roughly 600 mg EPA/DHA), starting as soon as possible after concussion, take: 3 tabs THREE TIMES a day  for the first 3 days, then (you will smell a little, sorry) 3 tabs TWICE DAILY  for the next 3 days, then 3 tabs ONCE DAILY  for the next 10 days    To help reduce HEADACHES: Coenzyme Q10 '160mg'$  ONCE DAILY Riboflavin/Vitamin B2 '400mg'$  ONCE DAILY Magnesium oxide 400 mg ONE-TWO TIMES DAILY May stop after headaches are resolved.                                                                                               To help with INSOMNIA: Melatonin 3-'5mg'$  AT BEDTIME Tart cherry extract, any dose at night    Other medicines to help decrease inflammation Alpha Lipoic Acid '100mg'$  TWICE DAILY Turmeric '500mg'$  twice daily Iron '65mg'$  elemental daily Vitamin D 4000 IU daily for 2 weeks then 2000 IU daily thereafter.  EXERCISES RANGE OF MOTION (ROM) AND STRETCHING EXERCISES  These exercises may help you when beginning to rehabilitate your issue. In order to successfully resolve your symptoms, you must improve your posture. These exercises are designed to help reduce the forward-head and rounded-shoulder posture which contributes to this condition. Your symptoms may resolve with or without further involvement from your physician, physical therapist or athletic trainer. While completing these exercises, remember:  Restoring tissue flexibility helps normal motion to return to the  joints. This allows healthier, less painful movement and activity. An effective stretch should be held for at least 20 seconds, although you may need to begin with shorter hold times for comfort. A stretch should never be painful. You should only feel a gentle lengthening or release in the stretched tissue. Do not do any stretch or exercise that you cannot tolerate.  STRETCH- Axial Extensors Lie on your back on the floor. You may bend your knees for comfort. Place a rolled-up hand towel or dish towel, about 2 inches in diameter, under the part of your head that makes contact with the floor. Gently tuck your chin, as if trying to make a "double chin," until you feel a gentle stretch at the base of your head. Hold 15-20 seconds. Repeat 2-3 times. Complete this exercise 1 time per day.   STRETCH - Axial Extension  Stand or sit on a firm surface. Assume a good posture: chest up, shoulders drawn back, abdominal muscles slightly tense, knees unlocked (if standing) and feet hip width apart. Slowly retract your chin so your head  slides back and your chin slightly lowers. Continue to look straight ahead. You should feel a gentle stretch in the back of your head. Be certain not to feel an aggressive stretch since this can cause headaches later. Hold for 15-20 seconds. Repeat 2-3 times. Complete this exercise 1 time per day.  STRETCH - Cervical Side Bend  Stand or sit on a firm surface. Assume a good posture: chest up, shoulders drawn back, abdominal muscles slightly tense, knees unlocked (if standing) and feet hip width apart. Without letting your nose or shoulders move, slowly tip your right / left ear to your shoulder until your feel a gentle stretch in the muscles on the opposite side of your neck. Hold 15-20 seconds. Repeat 2-3 times. Complete this exercise 1-2 times per day.  STRETCH - Cervical Rotators  Stand or sit on a firm surface. Assume a good posture: chest up, shoulders drawn back,  abdominal muscles slightly tense, knees unlocked (if standing) and feet hip width apart. Keeping your eyes level with the ground, slowly turn your head until you feel a gentle stretch along the back and opposite side of your neck. Hold 15-20 seconds. Repeat 2-3 times. Complete this exercise 1-2 times per day.  RANGE OF MOTION - Neck Circles  Stand or sit on a firm surface. Assume a good posture: chest up, shoulders drawn back, abdominal muscles slightly tense, knees unlocked (if standing) and feet hip width apart. Gently roll your head down and around from the back of one shoulder to the back of the other. The motion should never be forced or painful. Repeat the motion 10-20 times, or until you feel the neck muscles relax and loosen. Repeat 2-3 times. Complete the exercise 1-2 times per day. STRENGTHENING EXERCISES - Cervical Strain and Sprain These exercises may help you when beginning to rehabilitate your injury. They may resolve your symptoms with or without further involvement from your physician, physical therapist, or athletic trainer. While completing these exercises, remember:  Muscles can gain both the endurance and the strength needed for everyday activities through controlled exercises. Complete these exercises as instructed by your physician, physical therapist, or athletic trainer. Progress the resistance and repetitions only as guided. You may experience muscle soreness or fatigue, but the pain or discomfort you are trying to eliminate should never worsen during these exercises. If this pain does worsen, stop and make certain you are following the directions exactly. If the pain is still present after adjustments, discontinue the exercise until you can discuss the trouble with your clinician.  STRENGTH - Cervical Flexors, Isometric Face a wall, standing about 6 inches away. Place a small pillow, a ball about 6-8 inches in diameter, or a folded towel between your forehead and the  wall. Slightly tuck your chin and gently push your forehead into the soft object. Push only with mild to moderate intensity, building up tension gradually. Keep your jaw and forehead relaxed. Hold 10 to 20 seconds. Keep your breathing relaxed. Release the tension slowly. Relax your neck muscles completely before you start the next repetition. Repeat 2-3 times. Complete this exercise 1 time per day.  STRENGTH- Cervical Lateral Flexors, Isometric  Stand about 6 inches away from a wall. Place a small pillow, a ball about 6-8 inches in diameter, or a folded towel between the side of your head and the wall. Slightly tuck your chin and gently tilt your head into the soft object. Push only with mild to moderate intensity, building up tension gradually. Keep your  jaw and forehead relaxed. Hold 10 to 20 seconds. Keep your breathing relaxed. Release the tension slowly. Relax your neck muscles completely before you start the next repetition. Repeat 2-3 times. Complete this exercise 1 time per day.  STRENGTH - Cervical Extensors, Isometric  Stand about 6 inches away from a wall. Place a small pillow, a ball about 6-8 inches in diameter, or a folded towel between the back of your head and the wall. Slightly tuck your chin and gently tilt your head back into the soft object. Push only with mild to moderate intensity, building up tension gradually. Keep your jaw and forehead relaxed. Hold 10 to 20 seconds. Keep your breathing relaxed. Release the tension slowly. Relax your neck muscles completely before you start the next repetition. Repeat 2-3 times. Complete this exercise 1 time per day.  POSTURE AND BODY MECHANICS CONSIDERATIONS Keeping correct posture when sitting, standing or completing your activities will reduce the stress put on different body tissues, allowing injured tissues a chance to heal and limiting painful experiences. The following are general guidelines for improved posture. Your physician or  physical therapist will provide you with any instructions specific to your needs. While reading these guidelines, remember: The exercises prescribed by your provider will help you have the flexibility and strength to maintain correct postures. The correct posture provides the optimal environment for your joints to work. All of your joints have less wear and tear when properly supported by a spine with good posture. This means you will experience a healthier, less painful body. Correct posture must be practiced with all of your activities, especially prolonged sitting and standing. Correct posture is as important when doing repetitive low-stress activities (typing) as it is when doing a single heavy-load activity (lifting).  PROLONGED STANDING WHILE SLIGHTLY LEANING FORWARD When completing a task that requires you to lean forward while standing in one place for a long time, place either foot up on a stationary 2- to 4-inch high object to help maintain the best posture. When both feet are on the ground, the low back tends to lose its slight inward curve. If this curve flattens (or becomes too large), then the back and your other joints will experience too much stress, fatigue more quickly, and can cause pain.   RESTING POSITIONS Consider which positions are most painful for you when choosing a resting position. If you have pain with flexion-based activities (sitting, bending, stooping, squatting), choose a position that allows you to rest in a less flexed posture. You would want to avoid curling into a fetal position on your side. If your pain worsens with extension-based activities (prolonged standing, working overhead), avoid resting in an extended position such as sleeping on your stomach. Most people will find more comfort when they rest with their spine in a more neutral position, neither too rounded nor too arched. Lying on a non-sagging bed on your side with a pillow between your knees, or on your back  with a pillow under your knees will often provide some relief. Keep in mind, being in any one position for a prolonged period of time, no matter how correct your posture, can still lead to stiffness.  WALKING Walk with an upright posture. Your ears, shoulders, and hips should all line up. OFFICE WORK When working at a desk, create an environment that supports good, upright posture. Without extra support, muscles fatigue and lead to excessive strain on joints and other tissues.  CHAIR: A chair should be able to slide under  your desk when your back makes contact with the back of the chair. This allows you to work closely. The chair's height should allow your eyes to be level with the upper part of your monitor and your hands to be slightly lower than your elbows. Body position: Your feet should make contact with the floor. If this is not possible, use a foot rest. Keep your ears over your shoulders. This will reduce stress on your neck and low back.

## 2022-03-25 NOTE — Progress Notes (Signed)
Chief Complaint  Patient presents with   Fall    Subjective: Patient is a 57 y.o. female here for f/u fall.  Hit face 1.5 weeks ago. She does not remember the fall. She did lose consciousness. Neck is painful also, getting better. Using Tylenol for pain, a little ice. Having swelling over the nose. Bruising over the face. She is not bleeding. She did not seek care until now. Mood and memory are normal. Balance is good. Has had headaches but seem to be getting less frequent.   Past Medical History:  Diagnosis Date   Arthritis    Frequent headaches    GERD (gastroesophageal reflux disease)    High cholesterol    HPV (human papilloma virus) infection    Sleep apnea    Urine incontinence     Objective: BP 112/80 (BP Location: Left Arm, Patient Position: Sitting, Cuff Size: Normal)   Pulse 68   Temp 98.5 F (36.9 C) (Oral)   Ht '5\' 4"'$  (1.626 m)   Wt 131 lb 8 oz (59.6 kg)   LMP 12/16/2015   SpO2 99%   BMI 22.57 kg/m  General: Awake, appears stated age Neuro: Gait is normal, DTRs equal and symmetric throughout, 5/5 strength throughout MSK: Mild TTP over the cervical paraspinal musculature bilaterally; there is no bony tenderness over the sinuses or frontal bone bilaterally Skin: Ecchymosis noted over the left frontal bone and left maxillary bone, right axillary bone, hematoma on the upper left frontal bone. Healing laceration noted over the right parietal portion of the scalp Lungs: No accessory muscle use Nose: Mild cartilaginous deviation to the right with possible soft tissue swelling although mild also; nares are patent bilaterally Psych: Age appropriate judgment and insight, normal affect and mood  Assessment and Plan: Neck pain  Hematoma  Ecchymosis  Nose pain  Need for tetanus booster - Plan: Tdap vaccine greater than or equal to 7yo IM  Heat, ice, Tylenol, stretches and exercises. This should steadily resolve in the next month, reassurance. Reassurance for this  as well. I do not appreciate a significant gross deviation or any obstruction in her nose.  She is breathing fine so we will continue to monitor as she would not likely require any surgical intervention. Will update her tetanus shot today given the laceration on her scalp.  No signs of infection, she will likely have a relatively large scar.  She has a nice head of hair so it will not be obvious. The patient voiced understanding and agreement to the plan.  [place], DO 03/25/22  4:04 PM

## 2022-05-05 ENCOUNTER — Other Ambulatory Visit: Payer: Self-pay | Admitting: Family Medicine

## 2022-05-05 DIAGNOSIS — F329 Major depressive disorder, single episode, unspecified: Secondary | ICD-10-CM

## 2022-05-05 MED ORDER — BUSPIRONE HCL 15 MG PO TABS
15.0000 mg | ORAL_TABLET | Freq: Two times a day (BID) | ORAL | 1 refills | Status: DC
Start: 2022-05-05 — End: 2022-11-10

## 2022-05-18 ENCOUNTER — Other Ambulatory Visit: Payer: Self-pay | Admitting: Family Medicine

## 2022-05-18 NOTE — Telephone Encounter (Signed)
Last OV--03/25/22 Last RF--02/17/22--#90 no refills

## 2022-05-27 ENCOUNTER — Other Ambulatory Visit: Payer: Self-pay | Admitting: Family Medicine

## 2022-06-18 ENCOUNTER — Other Ambulatory Visit: Payer: Self-pay | Admitting: Family Medicine

## 2022-06-18 DIAGNOSIS — F331 Major depressive disorder, recurrent, moderate: Secondary | ICD-10-CM

## 2022-06-18 MED ORDER — DULOXETINE HCL 20 MG PO CPEP
20.0000 mg | ORAL_CAPSULE | Freq: Every day | ORAL | 0 refills | Status: DC
Start: 2022-06-18 — End: 2022-07-28

## 2022-07-28 ENCOUNTER — Other Ambulatory Visit: Payer: Self-pay | Admitting: Family Medicine

## 2022-07-28 ENCOUNTER — Encounter: Payer: Self-pay | Admitting: Family Medicine

## 2022-07-28 DIAGNOSIS — F331 Major depressive disorder, recurrent, moderate: Secondary | ICD-10-CM

## 2022-07-28 MED ORDER — DULOXETINE HCL 20 MG PO CPEP
20.0000 mg | ORAL_CAPSULE | Freq: Every day | ORAL | 0 refills | Status: DC
Start: 2022-07-28 — End: 2022-09-07

## 2022-07-28 MED ORDER — LEVOTHYROXINE SODIUM 75 MCG PO TABS: 75.0000 ug | ORAL_TABLET | Freq: Every day | ORAL | 0 refills | Status: DC

## 2022-08-25 ENCOUNTER — Other Ambulatory Visit: Payer: Self-pay | Admitting: Family Medicine

## 2022-09-07 ENCOUNTER — Telehealth: Payer: Self-pay | Admitting: Family Medicine

## 2022-09-07 DIAGNOSIS — F331 Major depressive disorder, recurrent, moderate: Secondary | ICD-10-CM

## 2022-09-08 ENCOUNTER — Other Ambulatory Visit (HOSPITAL_COMMUNITY): Payer: Self-pay

## 2022-09-08 MED ORDER — DULOXETINE HCL 20 MG PO CPEP
20.0000 mg | ORAL_CAPSULE | Freq: Every day | ORAL | 0 refills | Status: DC
Start: 2022-09-08 — End: 2022-09-16
  Filled 2022-09-08: qty 90, 90d supply, fill #0

## 2022-09-08 NOTE — Telephone Encounter (Signed)
Pt called stating that she meant to send the meds to the following pharmacy:  [facility] - [place], [place] - [address] [place] [address] Phone: [phone]  Fax: [phone]

## 2022-09-16 MED ORDER — DULOXETINE HCL 20 MG PO CPEP
20.0000 mg | ORAL_CAPSULE | Freq: Every day | ORAL | 1 refills | Status: DC
Start: 2022-09-16 — End: 2022-11-20

## 2022-09-16 NOTE — Telephone Encounter (Signed)
Pt states she has not heard anything [facility] and is down to a weeks worth of rx and getting worried. Please advise.

## 2022-09-16 NOTE — Addendum Note (Signed)
Addended by: Scharlene Gloss B on: 09/16/2022 01:59 PM   Modules accepted: Orders

## 2022-09-16 NOTE — Telephone Encounter (Signed)
Sent in #90 to [facility] #90 was sent to local pharmacy on 09/08/22 Called left a detailed message to the patient

## 2022-11-09 ENCOUNTER — Encounter: Payer: Self-pay | Admitting: Family Medicine

## 2022-11-09 DIAGNOSIS — F329 Major depressive disorder, single episode, unspecified: Secondary | ICD-10-CM

## 2022-11-10 MED ORDER — BUSPIRONE HCL 15 MG PO TABS
15.0000 mg | ORAL_TABLET | Freq: Two times a day (BID) | ORAL | 1 refills | Status: DC
Start: 2022-11-10 — End: 2022-11-20

## 2022-11-10 NOTE — Addendum Note (Signed)
Addended by: Scharlene Gloss B on: 11/10/2022 09:24 AM   Modules accepted: Orders

## 2022-11-20 ENCOUNTER — Telehealth: Payer: Self-pay | Admitting: Family Medicine

## 2022-11-20 DIAGNOSIS — F331 Major depressive disorder, recurrent, moderate: Secondary | ICD-10-CM

## 2022-11-20 DIAGNOSIS — F329 Major depressive disorder, single episode, unspecified: Secondary | ICD-10-CM

## 2022-11-20 MED ORDER — DULOXETINE HCL 20 MG PO CPEP: 20.0000 mg | ORAL_CAPSULE | Freq: Every day | ORAL | 1 refills | Status: DC

## 2022-11-20 MED ORDER — BUSPIRONE HCL 15 MG PO TABS: 15.0000 mg | ORAL_TABLET | Freq: Two times a day (BID) | ORAL | 1 refills | Status: AC

## 2022-11-20 MED ORDER — DULOXETINE HCL 20 MG PO CPEP: 20.0000 mg | ORAL_CAPSULE | Freq: Every day | ORAL | 1 refills | Status: AC

## 2022-11-20 NOTE — Addendum Note (Signed)
Addended by: Maximino Sarin on: 11/20/2022 02:52 PM   Modules accepted: Orders

## 2022-11-20 NOTE — Telephone Encounter (Signed)
Pt just called and is no longer using the mail service pharmacy. Please see other note sent.

## 2022-11-20 NOTE — Addendum Note (Signed)
Addended by: Maximino Sarin on: 11/20/2022 02:35 PM   Modules accepted: Orders

## 2022-11-20 NOTE — Telephone Encounter (Signed)
Script fixed

## 2022-11-20 NOTE — Telephone Encounter (Signed)
*  Pt recently changed insurances and needs this script redone.*  Prescription Request  11/20/2022  Is this a "Controlled Substance" medicine? No  LOV: Visit date not found  What is the name of the medication or equipment?   DULoxetine (CYMBALTA) 20 MG capsule [[id number]]  Have you contacted your pharmacy to request a refill? No   Which pharmacy would you like this sent to?   [facility]) [place], [place] - [address] [place] [address] Phone: [phone] Fax: [phone]    Patient notified that their request is being sent to the clinical staff for review and that they should receive a response within 2 business days.   Please advise at Mobile [phone] (mobile)

## 2022-11-20 NOTE — Telephone Encounter (Signed)
Rx sent 

## 2022-11-20 NOTE — Telephone Encounter (Signed)
Prescription Request  11/20/2022  Is this a "Controlled Substance" medicine? No  LOV: Visit date not found  What is the name of the medication or equipment?   busPIRone (BUSPAR) 15 MG tablet DULoxetine (CYMBALTA) 20 MG capsule   Have you contacted your pharmacy to request a refill? No   Which pharmacy would you like this sent to?   [facility]   Address: [address] [place], [place] [address] Phone: [phone]  Patient notified that their request is being sent to the clinical staff for review and that they should receive a response within 2 business days.   Please advise at [facility] [phone]

## 2022-12-14 ENCOUNTER — Encounter: Payer: Self-pay | Admitting: Family Medicine

## 2022-12-15 ENCOUNTER — Other Ambulatory Visit: Payer: Self-pay

## 2022-12-15 MED ORDER — ESOMEPRAZOLE MAGNESIUM 40 MG PO CPDR: DELAYED_RELEASE_CAPSULE | ORAL | 3 refills | Status: AC

## 2022-12-17 ENCOUNTER — Encounter: Payer: Self-pay | Admitting: Cardiology

## 2022-12-21 MED ORDER — FLUDROCORTISONE ACETATE 0.1 MG PO TABS: 0.1000 mg | ORAL_TABLET | Freq: Every day | ORAL | 0 refills | Status: DC

## 2022-12-21 MED ORDER — MIDODRINE HCL 5 MG PO TABS: 5.0000 mg | ORAL_TABLET | Freq: Two times a day (BID) | ORAL | 0 refills | Status: AC

## 2022-12-22 ENCOUNTER — Encounter: Payer: Self-pay | Admitting: Family Medicine

## 2022-12-22 ENCOUNTER — Other Ambulatory Visit: Payer: Self-pay

## 2022-12-22 MED ORDER — LEVOTHYROXINE SODIUM 75 MCG PO TABS: 75.0000 ug | ORAL_TABLET | Freq: Every day | ORAL | 3 refills | Status: DC

## 2023-02-08 ENCOUNTER — Other Ambulatory Visit: Payer: Self-pay | Admitting: Cardiology

## 2023-09-06 IMAGING — MG MM DIGITAL SCREENING BILAT W/ TOMO AND CAD
6 of 10 series · 6 of 30 positions shown · non-contrast
Comparison: Previous exam(s).

CLINICAL DATA: Screening.

EXAM:
DIGITAL SCREENING BILATERAL MAMMOGRAM WITH TOMOSYNTHESIS AND CAD
TECHNIQUE: Bilateral screening digital craniocaudal and mediolateral oblique
mammograms were obtained. Bilateral screening digital breast
tomosynthesis was performed. The images were evaluated with
computer-aided detection.

[L CC synth-2D]
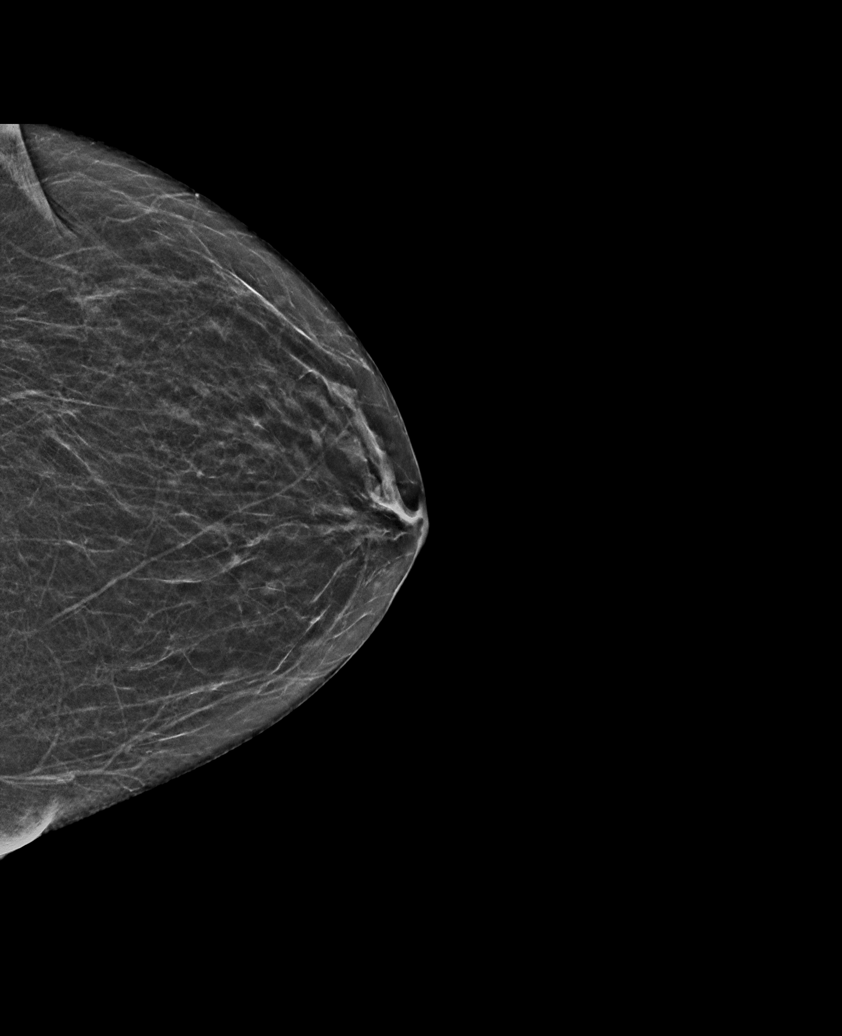

[R XCCL synth-2D]
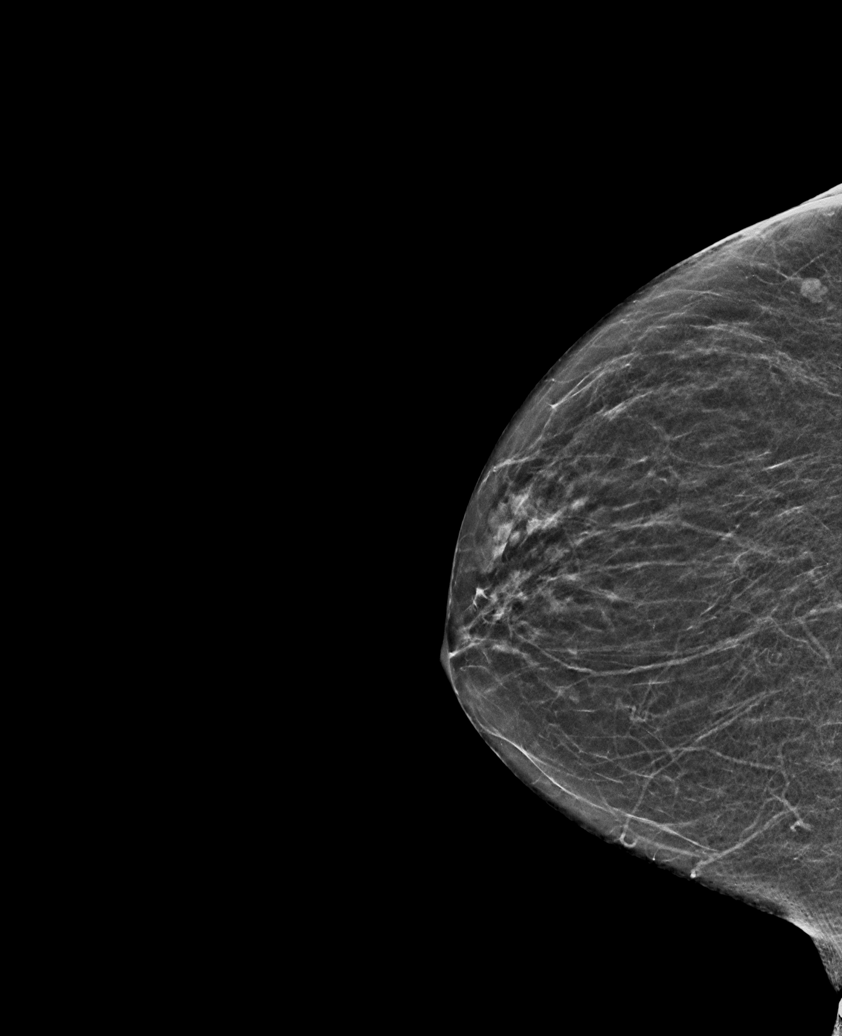

[R CC synth-2D]
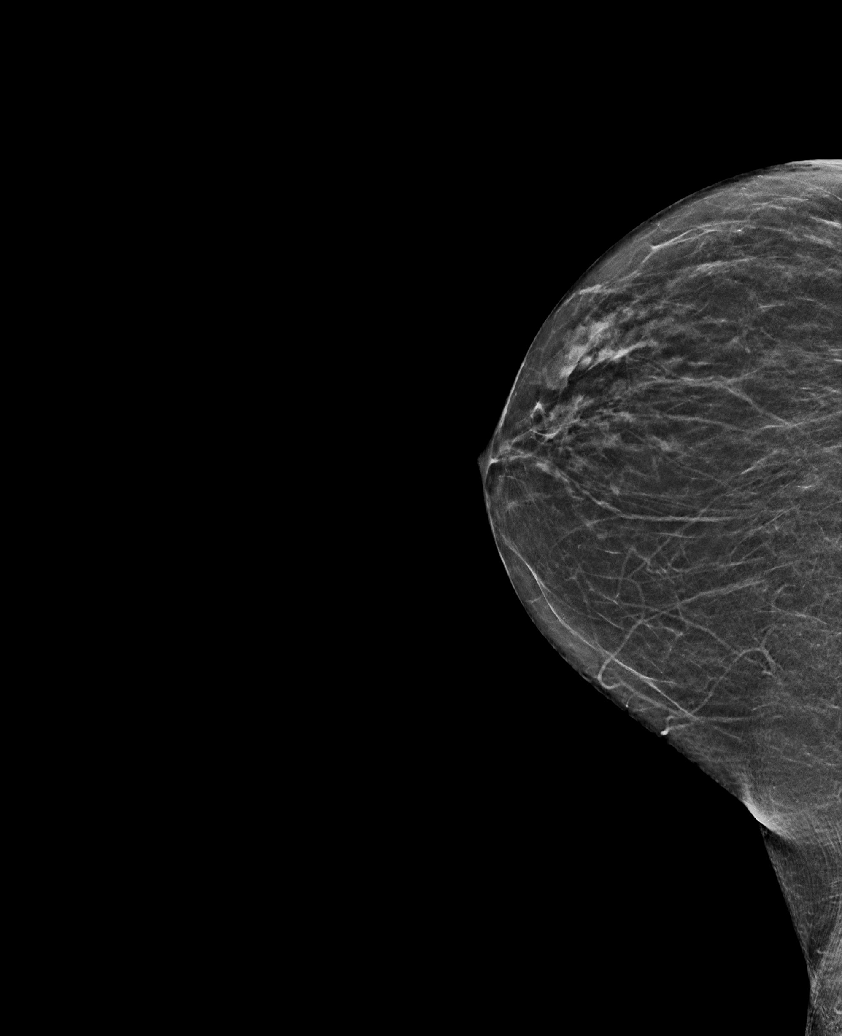

[R MLO synth-2D]
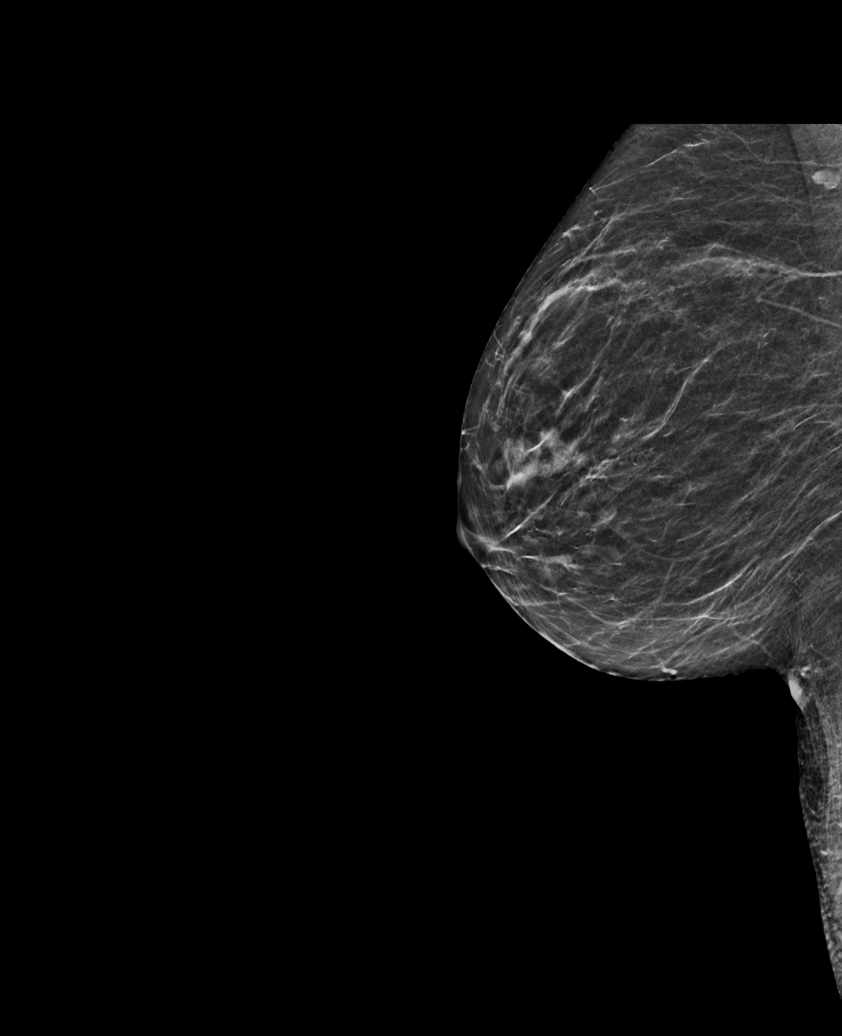

[L MLO synth-2D]
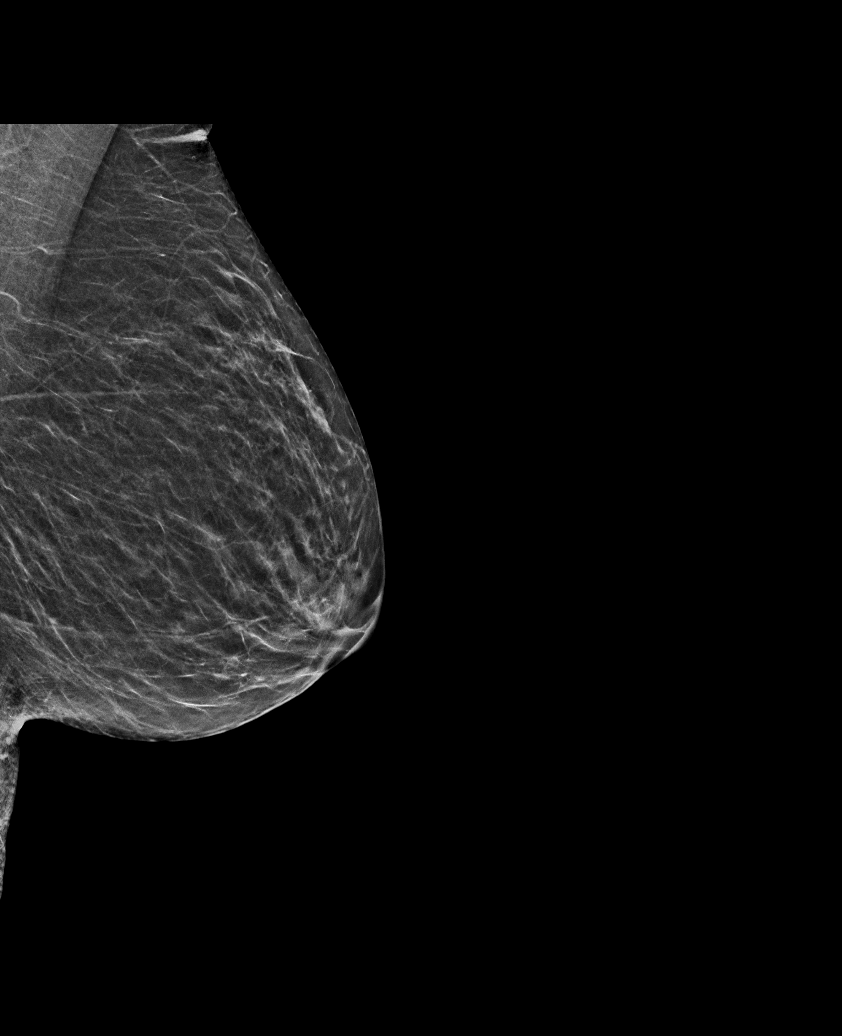

[L MLO tomo · tomo slice 23/44.0]
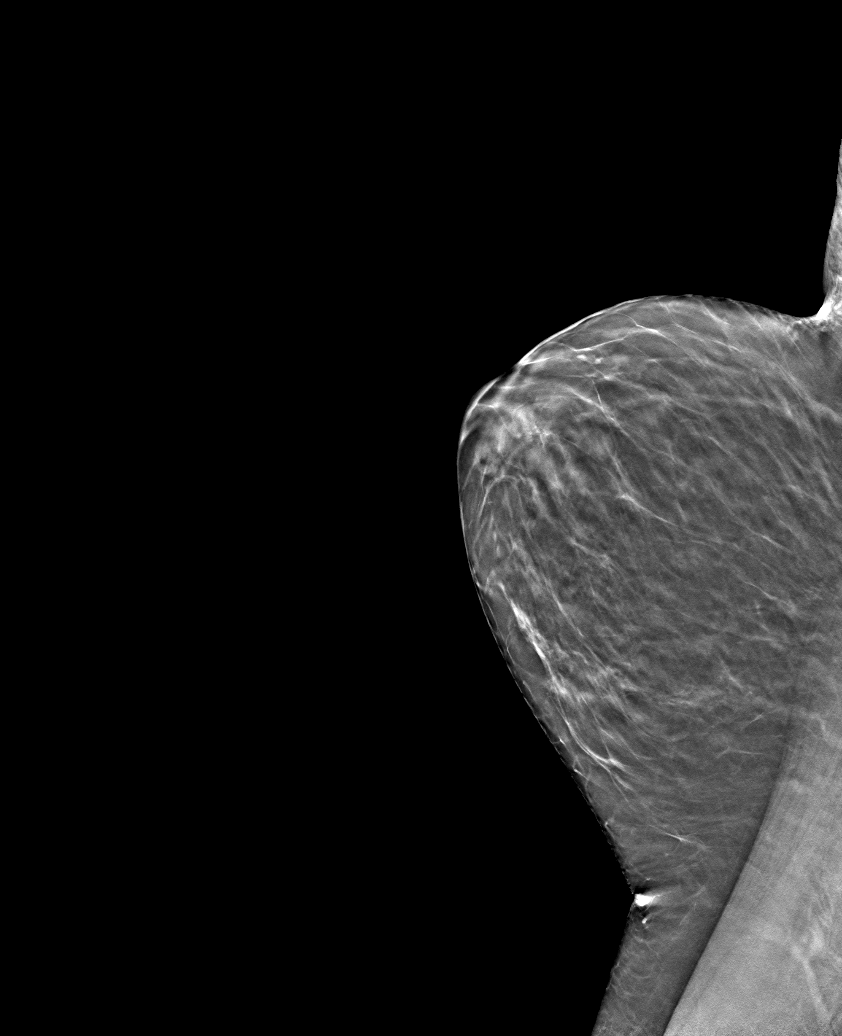

[6 of 30 positions shown; findings below may reference images not displayed]

ACR Breast Density Category b: There are scattered areas of
fibroglandular density.
FINDINGS: There are no findings suspicious for malignancy.
IMPRESSION: No mammographic evidence of malignancy. A result letter of this
screening mammogram will be mailed directly to the patient.

RECOMMENDATION:
Screening mammogram in one year. (Code:51-O-LD2)

BI-RADS CATEGORY  1: Negative.

## 2023-12-08 ENCOUNTER — Other Ambulatory Visit: Payer: Self-pay | Admitting: Family Medicine
